# Patient Record
Sex: Female | Born: 1970 | Race: Black or African American | Hispanic: No | Marital: Married | Smoking: Never smoker
Health system: Southern US, Community
[De-identification: ages and names within clinical notes are randomized; demographics above are authoritative.]

## PROBLEM LIST (undated history)

## (undated) ENCOUNTER — Inpatient Hospital Stay (HOSPITAL_COMMUNITY): Payer: Self-pay

## (undated) DIAGNOSIS — I340 Nonrheumatic mitral (valve) insufficiency: Secondary | ICD-10-CM

## (undated) DIAGNOSIS — E559 Vitamin D deficiency, unspecified: Secondary | ICD-10-CM

## (undated) DIAGNOSIS — D649 Anemia, unspecified: Secondary | ICD-10-CM

---

## 2008-08-22 DIAGNOSIS — I340 Nonrheumatic mitral (valve) insufficiency: Secondary | ICD-10-CM

## 2008-08-22 HISTORY — DX: Nonrheumatic mitral (valve) insufficiency: I34.0

## 2014-08-22 DIAGNOSIS — E559 Vitamin D deficiency, unspecified: Secondary | ICD-10-CM

## 2014-08-22 HISTORY — PX: DILATION AND CURETTAGE OF UTERUS: SHX78

## 2014-08-22 HISTORY — DX: Vitamin D deficiency, unspecified: E55.9

## 2017-07-03 ENCOUNTER — Inpatient Hospital Stay (HOSPITAL_COMMUNITY): Payer: Medicaid Other

## 2017-07-03 ENCOUNTER — Inpatient Hospital Stay (HOSPITAL_COMMUNITY)
Admission: AD | Admit: 2017-07-03 | Discharge: 2017-07-03 | Disposition: A | Discharge status: Home / Self Care | Payer: Medicaid Other | Source: Ambulatory Visit | Attending: Family Medicine | Admitting: Family Medicine

## 2017-07-03 ENCOUNTER — Encounter (HOSPITAL_COMMUNITY): Payer: Self-pay

## 2017-07-03 DIAGNOSIS — R1030 Lower abdominal pain, unspecified: Secondary | ICD-10-CM | POA: Insufficient documentation

## 2017-07-03 DIAGNOSIS — O30041 Twin pregnancy, dichorionic/diamniotic, first trimester: Secondary | ICD-10-CM | POA: Insufficient documentation

## 2017-07-03 DIAGNOSIS — O26891 Other specified pregnancy related conditions, first trimester: Secondary | ICD-10-CM

## 2017-07-03 DIAGNOSIS — Z3A08 8 weeks gestation of pregnancy: Secondary | ICD-10-CM

## 2017-07-03 DIAGNOSIS — R109 Unspecified abdominal pain: Secondary | ICD-10-CM

## 2017-07-03 DIAGNOSIS — O26899 Other specified pregnancy related conditions, unspecified trimester: Secondary | ICD-10-CM

## 2017-07-03 HISTORY — DX: Vitamin D deficiency, unspecified: E55.9

## 2017-07-03 HISTORY — DX: Nonrheumatic mitral (valve) insufficiency: I34.0

## 2017-07-03 HISTORY — DX: Anemia, unspecified: D64.9

## 2017-07-03 LAB — CBC
HEMATOCRIT: 33.4 % — AB (ref 36.0–46.0)
Hemoglobin: 10.5 g/dL — ABNORMAL LOW (ref 12.0–15.0)
MCH: 22.6 pg — ABNORMAL LOW (ref 26.0–34.0)
MCHC: 31.4 g/dL (ref 30.0–36.0)
MCV: 71.8 fL — ABNORMAL LOW (ref 78.0–100.0)
PLATELETS: 413 10*3/uL — AB (ref 150–400)
RBC: 4.65 MIL/uL (ref 3.87–5.11)
RDW: 16.8 % — AB (ref 11.5–15.5)
WBC: 9 10*3/uL (ref 4.0–10.5)

## 2017-07-03 LAB — ABO/RH: ABO/RH(D): O POS

## 2017-07-03 LAB — URINALYSIS, ROUTINE W REFLEX MICROSCOPIC
BILIRUBIN URINE: NEGATIVE
Glucose, UA: NEGATIVE mg/dL
HGB URINE DIPSTICK: NEGATIVE
Ketones, ur: NEGATIVE mg/dL
LEUKOCYTES UA: NEGATIVE
Nitrite: NEGATIVE
PROTEIN: 30 mg/dL — AB
SPECIFIC GRAVITY, URINE: 1.023 (ref 1.005–1.030)
pH: 5 (ref 5.0–8.0)

## 2017-07-03 LAB — WET PREP, GENITAL
Clue Cells Wet Prep HPF POC: NONE SEEN
Sperm: NONE SEEN
Trich, Wet Prep: NONE SEEN
YEAST WET PREP: NONE SEEN

## 2017-07-03 LAB — POCT PREGNANCY, URINE: PREG TEST UR: POSITIVE — AB

## 2017-07-03 LAB — HCG, QUANTITATIVE, PREGNANCY: HCG, BETA CHAIN, QUANT, S: 57281 m[IU]/mL — AB (ref <5)

## 2017-07-03 NOTE — MAU Note (Addendum)
Headache Right side Rating pain 8/10 Constant pain  +lower abdominal pain Bilateral; more right than left Rating pain 9/10 Constant pain; cramping in nature  Headache and abdominal pain since friday  +HPT Reports irregular periods; last period in September   Denies vaginal discharge or pain.  +nausea/vomiting

## 2017-07-03 NOTE — Discharge Instructions (Signed)
Abdominal Pain During Pregnancy Abdominal pain is common in pregnancy. Most of the time, it does not cause harm. There are many causes of abdominal pain. Some causes are more serious than others and sometimes the cause is not known. Abdominal pain can be a sign that something is very wrong with the pregnancy or the pain may have nothing to do with the pregnancy. Always tell your health care provider if you have any abdominal pain. Follow these instructions at home:  Do not have sex or put anything in your vagina until your symptoms go away completely.  Watch your abdominal pain for any changes.  Get plenty of rest until your pain improves.  Drink enough fluid to keep your urine clear or pale yellow.  Take over-the-counter or prescription medicines only as told by your health care provider.  Keep all follow-up visits as told by your health care provider. This is important. Contact a health care provider if:  You have a fever.  Your pain gets worse or you have cramping.  Your pain continues after resting. Get help right away if:  You are bleeding, leaking fluid, or passing tissue from the vagina.  You have vomiting or diarrhea that does not go away.  You have painful or bloody urination.  You notice a decrease in your baby's movements.  You feel very weak or faint.  You have shortness of breath.  You develop a severe headache with abdominal pain.  You have abnormal vaginal discharge with abdominal pain. This information is not intended to replace advice given to you by your health care provider. Make sure you discuss any questions you have with your health care provider. Document Released: 08/08/2005 Document Revised: 05/19/2016 Document Reviewed: 03/07/2013 Elsevier Interactive Patient Education  2018 [place] of Pregnancy The first trimester of pregnancy is from week 1 until the end of week 13 (months 1 through 3). A week after a sperm fertilizes an  egg, the egg will implant on the wall of the uterus. This embryo will begin to develop into a baby. Genes from you and your partner will form the baby. The female genes will determine whether the baby will be a boy or a girl. At 6-8 weeks, the eyes and face will be formed, and the heartbeat can be seen on ultrasound. At the end of 12 weeks, all the baby's organs will be formed. Now that you are pregnant, you will want to do everything you can to have a healthy baby. Two of the most important things are to get good prenatal care and to follow your health care provider's instructions. Prenatal care is all the medical care you receive before the baby's birth. This care will help prevent, find, and treat any problems during the pregnancy and childbirth. Body changes during your first trimester Your body goes through many changes during pregnancy. The changes vary from woman to woman.  You may gain or lose a couple of pounds at first.  You may feel sick to your stomach (nauseous) and you may throw up (vomit). If the vomiting is uncontrollable, call your health care provider.  You may tire easily.  You may develop headaches that can be relieved by medicines. All medicines should be approved by your health care provider.  You may urinate more often. Painful urination may mean you have a bladder infection.  You may develop heartburn as a result of your pregnancy.  You may develop constipation because certain hormones are causing the muscles  that push stool through your intestines to slow down.  You may develop hemorrhoids or swollen veins (varicose veins).  Your breasts may begin to grow larger and become tender. Your nipples may stick out more, and the tissue that surrounds them (areola) may become darker.  Your gums may bleed and may be sensitive to brushing and flossing.  Dark spots or blotches (chloasma, mask of pregnancy) may develop on your face. This will likely fade after the baby is  born.  Your menstrual periods will stop.  You may have a loss of appetite.  You may develop cravings for certain kinds of food.  You may have changes in your emotions from day to day, such as being excited to be pregnant or being concerned that something may go wrong with the pregnancy and baby.  You may have more vivid and strange dreams.  You may have changes in your hair. These can include thickening of your hair, rapid growth, and changes in texture. Some women also have hair loss during or after pregnancy, or hair that feels dry or thin. Your hair will most likely return to normal after your baby is born.  What to expect at prenatal visits During a routine prenatal visit:  You will be weighed to make sure you and the baby are growing normally.  Your blood pressure will be taken.  Your abdomen will be measured to track your baby's growth.  The fetal heartbeat will be listened to between weeks 10 and 14 of your pregnancy.  Test results from any previous visits will be discussed.  Your health care provider may ask you:  How you are feeling.  If you are feeling the baby move.  If you have had any abnormal symptoms, such as leaking fluid, bleeding, severe headaches, or abdominal cramping.  If you are using any tobacco products, including cigarettes, chewing tobacco, and electronic cigarettes.  If you have any questions.  Other tests that may be performed during your first trimester include:  Blood tests to find your blood type and to check for the presence of any previous infections. The tests will also be used to check for low iron levels (anemia) and protein on red blood cells (Rh antibodies). Depending on your risk factors, or if you previously had diabetes during pregnancy, you may have tests to check for high blood sugar that affects pregnant women (gestational diabetes).  Urine tests to check for infections, diabetes, or protein in the urine.  An ultrasound to  confirm the proper growth and development of the baby.  Fetal screens for spinal cord problems (spina bifida) and Down syndrome.  HIV (human immunodeficiency virus) testing. Routine prenatal testing includes screening for HIV, unless you choose not to have this test.  You may need other tests to make sure you and the baby are doing well.  Follow these instructions at home: Medicines  Follow your health care provider's instructions regarding medicine use. Specific medicines may be either safe or unsafe to take during pregnancy.  Take a prenatal vitamin that contains at least 600 micrograms (mcg) of folic acid.  If you develop constipation, try taking a stool softener if your health care provider approves. Eating and drinking  Eat a balanced diet that includes fresh fruits and vegetables, whole grains, good sources of protein such as meat, eggs, or tofu, and low-fat dairy. Your health care provider will help you determine the amount of weight gain that is right for you.  Avoid raw meat and uncooked cheese.  These carry germs that can cause birth defects in the baby.  Eating four or five small meals rather than three large meals a day may help relieve nausea and vomiting. If you start to feel nauseous, eating a few soda crackers can be helpful. Drinking liquids between meals, instead of during meals, also seems to help ease nausea and vomiting.  Limit foods that are high in fat and processed sugars, such as fried and sweet foods.  To prevent constipation: ? Eat foods that are high in fiber, such as fresh fruits and vegetables, whole grains, and beans. ? Drink enough fluid to keep your urine clear or pale yellow. Activity  Exercise only as directed by your health care provider. Most women can continue their usual exercise routine during pregnancy. Try to exercise for 30 minutes at least 5 days a week. Exercising will help you: ? Control your weight. ? Stay in shape. ? Be prepared for  labor and delivery.  Experiencing pain or cramping in the lower abdomen or lower back is a good sign that you should stop exercising. Check with your health care provider before continuing with normal exercises.  Try to avoid standing for long periods of time. Move your legs often if you must stand in one place for a long time.  Avoid heavy lifting.  Wear low-heeled shoes and practice good posture.  You may continue to have sex unless your health care provider tells you not to. Relieving pain and discomfort  Wear a good support bra to relieve breast tenderness.  Take warm sitz baths to soothe any pain or discomfort caused by hemorrhoids. Use hemorrhoid cream if your health care provider approves.  Rest with your legs elevated if you have leg cramps or low back pain.  If you develop varicose veins in your legs, wear support hose. Elevate your feet for 15 minutes, 3-4 times a day. Limit salt in your diet. Prenatal care  Schedule your prenatal visits by the twelfth week of pregnancy. They are usually scheduled monthly at first, then more often in the last 2 months before delivery.  Write down your questions. Take them to your prenatal visits.  Keep all your prenatal visits as told by your health care provider. This is important. Safety  Wear your seat belt at all times when driving.  Make a list of emergency phone numbers, including numbers for family, friends, the hospital, and police and fire departments. General instructions  Ask your health care provider for a referral to a local prenatal education class. Begin classes no later than the beginning of month 6 of your pregnancy.  Ask for help if you have counseling or nutritional needs during pregnancy. Your health care provider can offer advice or refer you to specialists for help with various needs.  Do not use hot tubs, steam rooms, or saunas.  Do not douche or use tampons or scented sanitary pads.  Do not cross your legs  for long periods of time.  Avoid cat litter boxes and soil used by cats. These carry germs that can cause birth defects in the baby and possibly loss of the fetus by miscarriage or stillbirth.  Avoid all smoking, herbs, alcohol, and medicines not prescribed by your health care provider. Chemicals in these products affect the formation and growth of the baby.  Do not use any products that contain nicotine or tobacco, such as cigarettes and e-cigarettes. If you need help quitting, ask your health care provider. You may receive counseling support and  other resources to help you quit.  Schedule a dentist appointment. At home, brush your teeth with a soft toothbrush and be gentle when you floss. Contact a health care provider if:  You have dizziness.  You have mild pelvic cramps, pelvic pressure, or nagging pain in the abdominal area.  You have persistent nausea, vomiting, or diarrhea.  You have a bad smelling vaginal discharge.  You have pain when you urinate.  You notice increased swelling in your face, hands, legs, or ankles.  You are exposed to fifth disease or chickenpox.  You are exposed to [place] measles (rubella) and have never had it. Get help right away if:  You have a fever.  You are leaking fluid from your vagina.  You have spotting or bleeding from your vagina.  You have severe abdominal cramping or pain.  You have rapid weight gain or loss.  You vomit blood or material that looks like coffee grounds.  You develop a severe headache.  You have shortness of breath.  You have any kind of trauma, such as from a fall or a car accident. Summary  The first trimester of pregnancy is from week 1 until the end of week 13 (months 1 through 3).  Your body goes through many changes during pregnancy. The changes vary from woman to woman.  You will have routine prenatal visits. During those visits, your health care provider will examine you, discuss any test results you  may have, and talk with you about how you are feeling. This information is not intended to replace advice given to you by your health care provider. Make sure you discuss any questions you have with your health care provider. Document Released: 08/02/2001 Document Revised: 07/20/2016 Document Reviewed: 07/20/2016 Elsevier Interactive Patient Education  2017 [place] Medications in Pregnancy   Acne: Benzoyl Peroxide Salicylic Acid  Backache/Headache: Tylenol: 2 regular strength every 4 hours OR              2 Extra strength every 6 hours  Colds/Coughs/Allergies: Benadryl (alcohol free) 25 mg every 6 hours as needed Breath right strips Claritin Cepacol throat lozenges Chloraseptic throat spray Cold-Eeze- up to three times per day Cough drops, alcohol free Flonase (by prescription only) Guaifenesin Mucinex Robitussin DM (plain only, alcohol free) Saline nasal spray/drops Sudafed (pseudoephedrine) & Actifed ** use only after [redacted] weeks gestation and if you do not have high blood pressure Tylenol Vicks Vaporub Zinc lozenges Zyrtec   Constipation: Colace Ducolax suppositories Fleet enema Glycerin suppositories Metamucil Milk of magnesia Miralax Senokot Smooth move tea  Diarrhea: Kaopectate Imodium A-D  *NO pepto Bismol  Hemorrhoids: Anusol Anusol HC Preparation H Tucks  Indigestion: Tums Maalox Mylanta Zantac  Pepcid  Insomnia: Benadryl (alcohol free) 25mg  every 6 hours as needed Tylenol PM Unisom, no Gelcaps  Leg Cramps: Tums MagGel  Nausea/Vomiting:  Bonine Dramamine Emetrol Ginger extract Sea bands Meclizine  Nausea medication to take during pregnancy:  Unisom (doxylamine succinate 25 mg tablets) Take one tablet daily at bedtime. If symptoms are not adequately controlled, the dose can be increased to a maximum recommended dose of two tablets daily (1/2 tablet in the morning, 1/2 tablet mid-afternoon and one at bedtime). Vitamin B6  100mg  tablets. Take one tablet twice a day (up to 200 mg per day).  Skin Rashes: Aveeno products Benadryl cream or 25mg  every 6 hours as needed Calamine Lotion 1% cortisone cream  Yeast infection: Gyne-lotrimin 7 Monistat 7   **If taking multiple medications, please check  labels to avoid duplicating the same active ingredients °**take medication as directed on the label °** Do not exceed 4000 mg of tylenol in 24 hours °**Do not take medications that contain aspirin or ibuprofen ° ° ° ° °

## 2017-07-03 NOTE — MAU Provider Note (Signed)
History     CSN: [id number]  Arrival date and time: 07/03/17 1016   First Provider Initiated Contact with Patient 07/03/17 1101      Chief Complaint  Patient presents with  . Possible Pregnancy  . Abdominal Pain  . Headache  . Emesis   HPI Holly Frye is a 46 y.o. [id number] at [redacted]w[redacted]d who presents with right sided lower abdominal pain. She states the pain started on Friday and is constant. She describes the pain as cramping and rates it a 9/10. She tried tylenol with no relief. She reports a positive HPT this weekend. She also reports nausea and vomiting over the weekend but none today. She reports a clear discharge with a foul odor but no bleeding. LMP mid September, hx of irregular periods.   OB History    Gravida Para Term Preterm AB Living   17 10 9 1 6 10    SAB TAB Ectopic Multiple Live Births   6       10      Past Medical History:  Diagnosis Date  . Anemia   . Mitral valve regurgitation 08/22/2008  . Vitamin D deficiency 08/22/2014    Past Surgical History:  Procedure Laterality Date  . DILATION AND CURETTAGE OF UTERUS  2016    Family History  Problem Relation Age of Onset  . Cancer Mother   . Cancer Maternal Aunt   . Cancer Maternal Uncle   . Cancer Paternal Aunt   . Cancer Paternal Uncle   . Cancer Maternal Grandmother   . Heart disease Maternal Grandfather     Social History   Tobacco Use  . Smoking status: Never Smoker  . Smokeless tobacco: Never Used  Substance Use Topics  . Alcohol use: No    Frequency: Never  . Drug use: No    Allergies: Not on File  No medications prior to admission.    Review of Systems  Constitutional: Negative.  Negative for fatigue and fever.  HENT: Negative.   Respiratory: Negative.  Negative for shortness of breath.   Cardiovascular: Negative.  Negative for chest pain.  Gastrointestinal: Positive for abdominal pain, nausea and vomiting. Negative for constipation and diarrhea.  Genitourinary: Positive for  vaginal discharge. Negative for dysuria and vaginal bleeding.  Neurological: Negative.  Negative for dizziness and headaches.   Physical Exam   Blood pressure (!) 143/72, pulse 90, temperature 98.3 F (36.8 C), temperature source Oral, resp. rate 18, weight 286 lb 0.6 oz (129.7 kg), last menstrual period 05/03/2017, SpO2 98 %.  Physical Exam  Nursing note and vitals reviewed. Constitutional: She is oriented to person, place, and time. She appears well-developed and well-nourished. No distress.  HENT:  Head: Normocephalic.  Eyes: Pupils are equal, round, and reactive to light.  Cardiovascular: Normal rate, regular rhythm and normal heart sounds.  Respiratory: Effort normal and breath sounds normal. No respiratory distress.  GI: Soft. Bowel sounds are normal. She exhibits no distension. There is tenderness in the right lower quadrant and suprapubic area. There is rebound and guarding.  Neurological: She is alert and oriented to person, place, and time.  Skin: Skin is warm and dry.  Psychiatric: She has a normal mood and affect. Her behavior is normal. Judgment and thought content normal.   Pelvic exam: Cervix pink, visually closed, without lesion, scant white creamy discharge, vaginal walls and external genitalia normal Bimanual exam: Cervix 0/long/high, firm, anterior, neg CMT, uterus nontender, adnexa without tenderness, enlargement, or mass   MAU Course  Procedures Results for orders placed or performed during the hospital encounter of 07/03/17 (from the past 24 hour(s))  Urinalysis, Routine w reflex microscopic     Status: Abnormal   Collection Time: 07/03/17 10:32 AM  Result Value Ref Range   Color, Urine YELLOW YELLOW   APPearance HAZY (A) CLEAR   Specific Gravity, Urine 1.023 1.005 - 1.030   pH 5.0 5.0 - 8.0   Glucose, UA NEGATIVE NEGATIVE mg/dL   Hgb urine dipstick NEGATIVE NEGATIVE   Bilirubin Urine NEGATIVE NEGATIVE   Ketones, ur NEGATIVE NEGATIVE mg/dL   Protein, ur 30  (A) NEGATIVE mg/dL   Nitrite NEGATIVE NEGATIVE   Leukocytes, UA NEGATIVE NEGATIVE   RBC / HPF 0-5 0 - 5 RBC/hpf   WBC, UA 0-5 0 - 5 WBC/hpf   Bacteria, UA RARE (A) NONE SEEN   Squamous Epithelial / LPF 0-5 (A) NONE SEEN   Mucus PRESENT   Pregnancy, urine POC     Status: Abnormal   Collection Time: 07/03/17 10:57 AM  Result Value Ref Range   Preg Test, Ur POSITIVE (A) NEGATIVE   US Ob Comp Less 14 Wks  Result Date: 07/03/2017 CLINICAL DATA:  Abdominal pain, pregnancy EXAM: TWIN OBSTETRIC <14WK [place] AND TRANSVAGINAL OB US COMPARISON:  None. FINDINGS: Number of IUPs:  2 Chorionicity/Amnionicity:  Dichorionic-diamniotic (thick membrane) TWIN 1 Yolk sac:  Visualized Embryo:  Not visualized Cardiac Activity: Not visualized Heart Rate:  bpm MSD: 26.1  mm   7 w   4  d CRL:    mm    w  d                  [place] EDC: TWIN 2 Yolk sac:  Not visualized Embryo:  Not visualized Cardiac Activity: Not visualized Heart Rate:  bpm MSD: 7.4  mm   5 w   3  d CRL:    mm    w  d                  [place] EDC: Subchorionic hemorrhage:  Small subchorionic hemorrhage Maternal uterus/adnexae: Posterior mid uterus fibroid measures up to 3.5 cm. Trace free fluid in the pelvis. IMPRESSION: Dichorionic diamniotic twin intrauterine pregnancy with discordant size is of the gestational sacs, the largest with SI estimated gestational age of [redacted] weeks 4 days and the smaller with an estimated gestational age of [redacted] weeks 3 days. No fetal pole in either gestational sac currently. Recommend follow-up ultrasound in 14 days to assess progression. Small subchorionic hemorrhage. Posterior midbody 3.5 cm uterine fibroid. Electronically Signed   By: Rolm Baptise M.D.   On: 07/03/2017 12:36   US Ob Comp Addl Gest Less 14 Wks  Result Date: 07/03/2017 CLINICAL DATA:  Abdominal pain, pregnancy EXAM: TWIN OBSTETRIC <14WK [place] AND TRANSVAGINAL OB US COMPARISON:  None. FINDINGS: Number of IUPs:  2 Chorionicity/Amnionicity:  Dichorionic-diamniotic (thick membrane)  TWIN 1 Yolk sac:  Visualized Embryo:  Not visualized Cardiac Activity: Not visualized Heart Rate:  bpm MSD: 26.1  mm   7 w   4  d CRL:    mm    w  d                  [place] EDC: TWIN 2 Yolk sac:  Not visualized Embryo:  Not visualized Cardiac Activity: Not visualized Heart Rate:  bpm MSD: 7.4  mm   5 w   3  d CRL:    mm  w  d                  [place] EDC: Subchorionic hemorrhage:  Small subchorionic hemorrhage Maternal uterus/adnexae: Posterior mid uterus fibroid measures up to 3.5 cm. Trace free fluid in the pelvis. IMPRESSION: Dichorionic diamniotic twin intrauterine pregnancy with discordant size is of the gestational sacs, the largest with SI estimated gestational age of [redacted] weeks 4 days and the smaller with an estimated gestational age of [redacted] weeks 3 days. No fetal pole in either gestational sac currently. Recommend follow-up ultrasound in 14 days to assess progression. Small subchorionic hemorrhage. Posterior midbody 3.5 cm uterine fibroid. Electronically Signed   By: Rolm Baptise M.D.   On: 07/03/2017 12:36   US Ob Transvaginal  Result Date: 07/03/2017 CLINICAL DATA:  Abdominal pain, pregnancy EXAM: TWIN OBSTETRIC <14WK [place] AND TRANSVAGINAL OB US COMPARISON:  None. FINDINGS: Number of IUPs:  2 Chorionicity/Amnionicity:  Dichorionic-diamniotic (thick membrane) TWIN 1 Yolk sac:  Visualized Embryo:  Not visualized Cardiac Activity: Not visualized Heart Rate:  bpm MSD: 26.1  mm   7 w   4  d CRL:    mm    w  d                  [place] EDC: TWIN 2 Yolk sac:  Not visualized Embryo:  Not visualized Cardiac Activity: Not visualized Heart Rate:  bpm MSD: 7.4  mm   5 w   3  d CRL:    mm    w  d                  [place] EDC: Subchorionic hemorrhage:  Small subchorionic hemorrhage Maternal uterus/adnexae: Posterior mid uterus fibroid measures up to 3.5 cm. Trace free fluid in the pelvis. IMPRESSION: Dichorionic diamniotic twin intrauterine pregnancy with discordant size is of the gestational sacs, the largest with SI estimated gestational  age of [redacted] weeks 4 days and the smaller with an estimated gestational age of [redacted] weeks 3 days. No fetal pole in either gestational sac currently. Recommend follow-up ultrasound in 14 days to assess progression. Small subchorionic hemorrhage. Posterior midbody 3.5 cm uterine fibroid. Electronically Signed   By: Rolm Baptise M.D.   On: 07/03/2017 12:36   MDM UA, UPT CBC, HCG, ABO/Rh Wet prep and gc/chlamydia US OB Transvaginal  US OB Comp less 14 weeks- twin intrauterine pregnancy, no fetal pole in either sac yet, small subchorionic hemorrhage, 3.5cm uterine fibroid Low suspicion for appendicitis due to location of pain and absence of fever, leukocytosis and GI complaints.  Assessment and Plan   1. Dichorionic diamniotic twin pregnancy in first trimester   2. Abdominal pain affecting pregnancy   3. [redacted] weeks gestation of pregnancy    -Discharge home in stable condition -Strict appendicitis precautions discussed -Patient advised to follow-[facility] with [facility] ultrasound for follow up ultrasound, will call with appointment Patient may return to MAU as needed or if her condition were to change or worsen   Wende Mott CNM 07/03/2017, 11:28 AM

## 2017-07-04 LAB — GC/CHLAMYDIA PROBE AMP (~~LOC~~) NOT AT ARMC
Chlamydia: NEGATIVE
Neisseria Gonorrhea: NEGATIVE

## 2017-07-17 ENCOUNTER — Ambulatory Visit (HOSPITAL_COMMUNITY)
Admission: RE | Admit: 2017-07-17 | Discharge: 2017-07-17 | Disposition: A | Discharge status: Home / Self Care | Payer: Medicaid Other | Source: Ambulatory Visit

## 2017-07-17 ENCOUNTER — Ambulatory Visit: Payer: Self-pay | Admitting: General Practice

## 2017-07-17 DIAGNOSIS — O30041 Twin pregnancy, dichorionic/diamniotic, first trimester: Secondary | ICD-10-CM | POA: Insufficient documentation

## 2017-07-17 DIAGNOSIS — O3680X1 Pregnancy with inconclusive fetal viability, fetus 1: Secondary | ICD-10-CM

## 2017-07-17 DIAGNOSIS — O3411 Maternal care for benign tumor of corpus uteri, first trimester: Secondary | ICD-10-CM | POA: Insufficient documentation

## 2017-07-17 DIAGNOSIS — Z3A01 Less than 8 weeks gestation of pregnancy: Secondary | ICD-10-CM | POA: Diagnosis not present

## 2017-07-17 DIAGNOSIS — D259 Leiomyoma of uterus, unspecified: Secondary | ICD-10-CM | POA: Diagnosis not present

## 2017-07-17 NOTE — Progress Notes (Signed)
I have reviewed the nurse's note and plan of care. I agree.  Marcille Buffy  5:00 PM 07/17/17

## 2017-07-17 NOTE — Progress Notes (Signed)
Patient here for viability ultrasound results today. Reviewed with Marcille Buffy who states ultrasound shows no progression of baby b and baby a has had some growth compared to last ultrasound but not confirmatory for viability. Patient should have repeat ultrasound in 2 weeks. Informed patient of results & recommendations. Patient had lots of questions about what was seen today compared to last time and what they are waiting to see. Answered all of patient's questions. Patient was upset about loss of baby b. Scheduled follow up ultrasound for 12/10 @ 1045 and informed patient. Recommended she go to MAU for heavy bleeding like a period or severe abdominal pain. Patient verbalized understanding and had no questions

## 2017-07-23 ENCOUNTER — Inpatient Hospital Stay (HOSPITAL_COMMUNITY)
Admission: AD | Admit: 2017-07-23 | Discharge: 2017-07-23 | Disposition: A | Discharge status: Home / Self Care | Payer: Medicaid Other | Source: Ambulatory Visit | Attending: Obstetrics & Gynecology | Admitting: Obstetrics & Gynecology

## 2017-07-23 ENCOUNTER — Inpatient Hospital Stay (HOSPITAL_COMMUNITY): Payer: Medicaid Other

## 2017-07-23 ENCOUNTER — Encounter (HOSPITAL_COMMUNITY): Payer: Self-pay

## 2017-07-23 ENCOUNTER — Other Ambulatory Visit: Payer: Self-pay

## 2017-07-23 DIAGNOSIS — N939 Abnormal uterine and vaginal bleeding, unspecified: Secondary | ICD-10-CM | POA: Diagnosis present

## 2017-07-23 DIAGNOSIS — Z3A00 Weeks of gestation of pregnancy not specified: Secondary | ICD-10-CM | POA: Insufficient documentation

## 2017-07-23 DIAGNOSIS — R1030 Lower abdominal pain, unspecified: Secondary | ICD-10-CM | POA: Insufficient documentation

## 2017-07-23 DIAGNOSIS — R9389 Abnormal findings on diagnostic imaging of other specified body structures: Secondary | ICD-10-CM | POA: Insufficient documentation

## 2017-07-23 DIAGNOSIS — O3110X0 Continuing pregnancy after spontaneous abortion of one fetus or more, unspecified trimester, not applicable or unspecified: Secondary | ICD-10-CM

## 2017-07-23 DIAGNOSIS — O200 Threatened abortion: Secondary | ICD-10-CM | POA: Insufficient documentation

## 2017-07-23 DIAGNOSIS — M545 Low back pain: Secondary | ICD-10-CM | POA: Diagnosis present

## 2017-07-23 DIAGNOSIS — O209 Hemorrhage in early pregnancy, unspecified: Secondary | ICD-10-CM

## 2017-07-23 DIAGNOSIS — O09521 Supervision of elderly multigravida, first trimester: Secondary | ICD-10-CM | POA: Insufficient documentation

## 2017-07-23 DIAGNOSIS — O26891 Other specified pregnancy related conditions, first trimester: Secondary | ICD-10-CM | POA: Insufficient documentation

## 2017-07-23 DIAGNOSIS — O3121X0 Continuing pregnancy after intrauterine death of one fetus or more, first trimester, not applicable or unspecified: Secondary | ICD-10-CM

## 2017-07-23 LAB — CBC
HCT: 35.4 % — ABNORMAL LOW (ref 36.0–46.0)
Hemoglobin: 10.6 g/dL — ABNORMAL LOW (ref 12.0–15.0)
MCH: 22.5 pg — AB (ref 26.0–34.0)
MCHC: 29.9 g/dL — ABNORMAL LOW (ref 30.0–36.0)
MCV: 75.2 fL — AB (ref 78.0–100.0)
PLATELETS: 376 10*3/uL (ref 150–400)
RBC: 4.71 MIL/uL (ref 3.87–5.11)
RDW: 18.3 % — ABNORMAL HIGH (ref 11.5–15.5)
WBC: 8.6 10*3/uL (ref 4.0–10.5)

## 2017-07-23 NOTE — Discharge Instructions (Signed)
Pelvic Rest Pelvic rest may be recommended if:  Your placenta is partially or completely covering the opening of your cervix (placenta previa).  There is bleeding between the wall of the uterus and the amniotic sac in the first trimester of pregnancy (subchorionic hemorrhage).  You went into labor too early (preterm labor).  Based on your overall health and the health of your baby, your health care provider will decide if pelvic rest is right for you. How do I rest my pelvis? For as long as told by your health care provider:  Do not have sex, sexual stimulation, or an orgasm.  Do not use tampons. Do not douche. Do not put anything in your vagina.  Do not lift anything that is heavier than 10 lb (4.5 kg).  Avoid activities that take a lot of effort (are strenuous).  Avoid any activity in which your pelvic muscles could become strained.  When should I seek medical care? Seek medical care if you have:  Cramping pain in your lower abdomen.  Vaginal discharge.  A low, dull backache.  Regular contractions.  Uterine tightening.  When should I seek immediate medical care? Seek immediate medical care if:  You have vaginal bleeding and you are pregnant.  This information is not intended to replace advice given to you by your health care provider. Make sure you discuss any questions you have with your health care provider. Document Released: 12/03/2010 Document Revised: 01/14/2016 Document Reviewed: 02/09/2015 Elsevier Interactive Patient Education  2018 [employer].  Threatened Miscarriage A threatened miscarriage is when you have vaginal bleeding during your first 20 weeks of pregnancy but the pregnancy has not ended. Your doctor will do tests to make sure you are still pregnant. The cause of the bleeding may not be known. This condition does not mean your pregnancy will end. It does increase the risk of it ending (complete miscarriage). Follow these instructions at  home:  Make sure you keep all your doctor visits for prenatal care.  Get plenty of rest.  Do not have sex or use tampons if you have vaginal bleeding.  Do not douche.  Do not smoke or use drugs.  Do not drink alcohol.  Avoid caffeine. Contact a doctor if:  You have light bleeding from your vagina.  You have belly pain or cramping.  You have a fever. Get help right away if:  You have heavy bleeding from your vagina.  You have clots of blood coming from your vagina.  You have bad pain or cramps in your low back or belly.  You have fever, chills, and bad belly pain. This information is not intended to replace advice given to you by your health care provider. Make sure you discuss any questions you have with your health care provider. Document Released: 07/21/2008 Document Revised: 01/14/2016 Document Reviewed: 06/04/2013 Elsevier Interactive Patient Education  [employer].

## 2017-07-23 NOTE — MAU Note (Signed)
Abdominal pain with increased bleeding with blood clots now.

## 2017-07-23 NOTE — MAU Provider Note (Signed)
History     CSN: [id number]  Arrival date and time: 07/23/17 1408   First Provider Initiated Contact with Patient 07/23/17 1526      Chief Complaint  Patient presents with  . Abdominal Cramping  . Vaginal Bleeding   HPI   Ms.Holly Frye is a 46 y.o. female [id number] unknown gestation here in MAU with vaginal bleeding and abdominal pain. The bleeding started early this morning and has progressed throughout the day to menstrual like bleeding.  The pain is in her lower back and lower abdomen.  [place] on 11/26 show vanishing twin.  Patient rates her pain 5/10; she declined pain medication.   OB History    Gravida Para Term Preterm AB Living   17 10 9 1 6 10    SAB TAB Ectopic Multiple Live Births   6       10      Past Medical History:  Diagnosis Date  . Anemia   . Mitral valve regurgitation 08/22/2008  . Vitamin D deficiency 08/22/2014    Past Surgical History:  Procedure Laterality Date  . DILATION AND CURETTAGE OF UTERUS  2016    Family History  Problem Relation Age of Onset  . Cancer Mother   . Cancer Maternal Aunt   . Cancer Maternal Uncle   . Cancer Paternal Aunt   . Cancer Paternal Uncle   . Cancer Maternal Grandmother   . Heart disease Maternal Grandfather     Social History   Tobacco Use  . Smoking status: Never Smoker  . Smokeless tobacco: Never Used  Substance Use Topics  . Alcohol use: No    Frequency: Never  . Drug use: No    Allergies:  Allergies  Allergen Reactions  . Lactose Intolerance (Gi) Diarrhea    Medications Prior to Admission  Medication Sig Dispense Refill Last Dose  . acetaminophen (TYLENOL) 500 MG tablet Take 1,000 mg by mouth every 6 (six) hours as needed for moderate pain or headache.   Past Week at Unknown time  . Prenatal Vit-Fe Fumarate-FA (PRENATAL MULTIVITAMIN) TABS tablet Take 1 tablet by mouth daily at 12 noon.   07/23/2017 at Unknown time   Results for orders placed or performed during the hospital encounter of  07/23/17 (from the past 48 hour(s))  CBC     Status: Abnormal   Collection Time: 07/23/17  3:32 PM  Result Value Ref Range   WBC 8.6 4.0 - 10.5 K/uL   RBC 4.71 3.87 - 5.11 MIL/uL   Hemoglobin 10.6 (L) 12.0 - 15.0 g/dL   HCT 35.4 (L) 36.0 - 46.0 %   MCV 75.2 (L) 78.0 - 100.0 fL   MCH 22.5 (L) 26.0 - 34.0 pg   MCHC 29.9 (L) 30.0 - 36.0 g/dL   RDW 18.3 (H) 11.5 - 15.5 %   Platelets 376 150 - 400 K/uL   US Ob Transvaginal  Result Date: 07/23/2017 CLINICAL DATA:  History of twin pregnancy. Increased vaginal bleeding. EXAM: TRANSVAGINAL OB ULTRASOUND TECHNIQUE: Transvaginal ultrasound was performed for complete evaluation of the gestation as well as the maternal uterus, adnexal regions, and pelvic cul-de-sac. COMPARISON:  None. FINDINGS: Intrauterine gestational sac: There is an intrauterine gestational sac identified within mean diameter 2.8 cm. Yolk sac:  Visualized. Embryo:  Probable Cardiac Activity: Not Visualized. MSD: 28  mm   7 w   5  d CRL:   29  mm   5 w 5 d  [place] EDC: March 20, 2018 Subchorionic hemorrhage: The previously identified subchorionic hemorrhage is not seen today. An adjacent cystic structure measures 8 x 8 x 8 mm with no yolk sac or fetal pole. This was previously described as a second gestational sac. An endometrial cyst or synechiae associated with the first gestational sac are also possible. Maternal uterus/adnexae: Normal IMPRESSION: 1. There is at least 1 intrauterine pregnancy. The mean sac diameter is 28 mm today versus 23.1 mm previously. There is a yolk sac. There is a suspected fetal pole. The suspected fetal pole measures 2.9 mm today versus 3.7 mm previously. No cardiac activity is identified. The findings are suspicious for but not diagnostic of non viability. Recommend a follow-up ultrasound to assess viability. 2. The subchorionic hemorrhage seen previously is not visualized today. 3. The adjacent fluid collection could represent a second gestational sac.  The mean diameter is 8 mm today versus 6.9 mm previously. No yolk sac or fetal pole. An endometrial cyst or a synechiae associated with the first gestational sac are possibilities as well. Recommend attention on follow-up. Electronically Signed   By: Dorise Bullion III M.D   On: 07/23/2017 16:37   Review of Systems  Constitutional: Negative for fever.  Gastrointestinal: Positive for abdominal pain.  Genitourinary: Positive for vaginal bleeding. Negative for dysuria.   Physical Exam   Blood pressure 114/67, pulse 90, temperature 98.8 F (37.1 C), temperature source Oral, resp. rate 18, last menstrual period 05/03/2017.  Physical Exam  Constitutional: She is oriented to person, place, and time. She appears well-developed and well-nourished. No distress.  GI: There is generalized tenderness. There is no rigidity and no guarding.  Genitourinary:  Genitourinary Comments: Vagina - Small amount of dark red blood in the vaginal canal, no odor  Cervix - + active bleeding, small amount   Bimanual exam: Cervix closed, anterior  Uterus non tender, enlarged Chaperone present for exam.   Musculoskeletal: Normal range of motion.  Neurological: She is alert and oriented to person, place, and time.  Skin: Skin is warm. She is not diaphoretic.  Psychiatric: Her behavior is normal.   MAU Course  Procedures  None  MDM  O positive blood type Patient appears uncomfortable: pain medication offered and patient declined [place] ordered CBC Discussed [place] in detail with the patient and family. [place] is essentially unchanged from previous US. Unable to say whether this is bleeding from subchorionic hemorrhage that was seen on previous US. [place] is suspicious for non viable pregnancy, however not definitive. Patient is scheduled for an [place] on 12/12 and is encouraged to keep that [place]. This is a unplanned, however desired pregnancy.   Assessment and Plan   A:  1. Threatened miscarriage   2. Vaginal bleeding in  pregnancy, first trimester   3. Vanishing twin syndrome     P:  Discharge home with strict return precautions Miscarriage precautions with bleeding precautions Follow up for [place] as scheduled Ok to use tylenol as needed as directed on the bottle   Noni Saupe I, NP 07/23/2017 5:32 PM

## 2017-07-24 ENCOUNTER — Inpatient Hospital Stay (HOSPITAL_COMMUNITY)
Admission: AD | Admit: 2017-07-24 | Discharge: 2017-07-25 | Disposition: A | Discharge status: Home / Self Care | Payer: Medicaid Other | Source: Ambulatory Visit | Attending: Obstetrics and Gynecology | Admitting: Obstetrics and Gynecology

## 2017-07-24 DIAGNOSIS — O209 Hemorrhage in early pregnancy, unspecified: Secondary | ICD-10-CM

## 2017-07-24 DIAGNOSIS — O039 Complete or unspecified spontaneous abortion without complication: Secondary | ICD-10-CM | POA: Diagnosis not present

## 2017-07-24 DIAGNOSIS — Z79899 Other long term (current) drug therapy: Secondary | ICD-10-CM | POA: Insufficient documentation

## 2017-07-24 DIAGNOSIS — Z9889 Other specified postprocedural states: Secondary | ICD-10-CM | POA: Diagnosis not present

## 2017-07-24 DIAGNOSIS — E559 Vitamin D deficiency, unspecified: Secondary | ICD-10-CM | POA: Insufficient documentation

## 2017-07-24 DIAGNOSIS — Z91011 Allergy to milk products: Secondary | ICD-10-CM | POA: Diagnosis not present

## 2017-07-24 DIAGNOSIS — Z3A01 Less than 8 weeks gestation of pregnancy: Secondary | ICD-10-CM | POA: Insufficient documentation

## 2017-07-24 LAB — CBC
HEMATOCRIT: 34.3 % — AB (ref 36.0–46.0)
HEMOGLOBIN: 10.4 g/dL — AB (ref 12.0–15.0)
MCH: 22.8 pg — ABNORMAL LOW (ref 26.0–34.0)
MCHC: 30.3 g/dL (ref 30.0–36.0)
MCV: 75.1 fL — ABNORMAL LOW (ref 78.0–100.0)
Platelets: 351 10*3/uL (ref 150–400)
RBC: 4.57 MIL/uL (ref 3.87–5.11)
RDW: 18.1 % — ABNORMAL HIGH (ref 11.5–15.5)
WBC: 8.2 10*3/uL (ref 4.0–10.5)

## 2017-07-24 NOTE — MAU Note (Signed)
Pt here with c/o heavy bleeding that started about 2200. Severe cramping as well. Was here yesterday with a small amount of bleeding.

## 2017-07-25 ENCOUNTER — Encounter (HOSPITAL_COMMUNITY): Payer: Self-pay | Admitting: *Deleted

## 2017-07-25 ENCOUNTER — Inpatient Hospital Stay (HOSPITAL_COMMUNITY): Payer: Medicaid Other

## 2017-07-25 DIAGNOSIS — O039 Complete or unspecified spontaneous abortion without complication: Secondary | ICD-10-CM

## 2017-07-25 MED ORDER — HYDROMORPHONE HCL 1 MG/ML IJ SOLN
1.0000 mg | Freq: Once | INTRAMUSCULAR | Status: AC
  Administered 2017-07-25: 1 mg via INTRAMUSCULAR
  Filled 2017-07-25: qty 1

## 2017-07-25 MED ORDER — OXYCODONE-ACETAMINOPHEN 5-325 MG PO TABS: 1.0000 | ORAL_TABLET | Freq: Four times a day (QID) | ORAL | 0 refills | Status: AC | PRN

## 2017-07-25 MED ORDER — OXYCODONE-ACETAMINOPHEN 5-325 MG PO TABS: 2.0000 | ORAL_TABLET | Freq: Once | ORAL | Status: DC

## 2017-07-25 NOTE — MAU Note (Signed)
Pt reports increase in bleeding since 2200. Pt rates pain 10/10. Has soaked through 2 pads in the last hour.

## 2017-07-25 NOTE — MAU Provider Note (Signed)
Chief Complaint: Vaginal Bleeding and Abdominal Pain   None     SUBJECTIVE HPI: Holly Frye is a 46 y.o. [id number] at [redacted]w[redacted]d by LMP who presents to maternity admissions reporting severe lower abdominal cramps and heavy bleeding with blood clots starting tonight. She presented to MAU on 07/23/17 with bleeding and pain and had US showing suspicion for failed pregnancy but no definitive diagnosis.  She reports the pain and bleeding increased tonight and became worse before she came to MAU.  She reports soaking a pad per hour with large clots and tissue at home. Since arrival in MAU, her pain is increasing, as low abdominal intermittent cramping, but her bleeding has reduced.  She has not tried any treatments. There are no other associated symptoms.    HPI  Past Medical History:  Diagnosis Date  . Anemia   . Mitral valve regurgitation 08/22/2008  . Vitamin D deficiency 08/22/2014   Past Surgical History:  Procedure Laterality Date  . DILATION AND CURETTAGE OF UTERUS  2016   Social History   Socioeconomic History  . Marital status: Married    Spouse name: Not on file  . Number of children: Not on file  . Years of education: Not on file  . Highest education level: Not on file  Social Needs  . Financial resource strain: Not on file  . Food insecurity - worry: Not on file  . Food insecurity - inability: Not on file  . Transportation needs - medical: Not on file  . Transportation needs - non-medical: Not on file  Occupational History  . Not on file  Tobacco Use  . Smoking status: Never Smoker  . Smokeless tobacco: Never Used  Substance and Sexual Activity  . Alcohol use: No    Frequency: Never  . Drug use: No  . Sexual activity: Yes    Birth control/protection: None  Other Topics Concern  . Not on file  Social History Narrative  . Not on file   No current facility-administered medications on file prior to encounter.    Current Outpatient Medications on File Prior to  Encounter  Medication Sig Dispense Refill  . acetaminophen (TYLENOL) 500 MG tablet Take 1,000 mg by mouth every 6 (six) hours as needed for moderate pain or headache.    . Prenatal Vit-Fe Fumarate-FA (PRENATAL MULTIVITAMIN) TABS tablet Take 1 tablet by mouth daily at 12 noon.     Allergies  Allergen Reactions  . Lactose Intolerance (Gi) Diarrhea    ROS:  Review of Systems  Constitutional: Negative for chills, fatigue and fever.  Respiratory: Negative for shortness of breath.   Cardiovascular: Negative for chest pain.  Gastrointestinal: Positive for abdominal pain. Negative for nausea and vomiting.  Genitourinary: Positive for pelvic pain and vaginal bleeding. Negative for difficulty urinating, dysuria, flank pain, vaginal discharge and vaginal pain.  Musculoskeletal: Positive for back pain.  Neurological: Negative for dizziness and headaches.  Psychiatric/Behavioral: Negative.      I have reviewed patient's Past Medical Hx, Surgical Hx, Family Hx, Social Hx, medications and allergies.   Physical Exam   Patient Vitals for the past 24 hrs:  BP Temp Temp src Pulse Resp Height Weight  07/25/17 0234 131/72 - - 83 18 - -  07/24/17 2341 130/69 98.4 F (36.9 C) Oral 96 20 5\' 5"  (1.651 m) 286 lb (129.7 kg)   Constitutional: Well-developed, well-nourished female in moderate distress.  Cardiovascular: normal rate Respiratory: normal effort GI: Abd soft, non-tender. Pos BS x 4 MS: Extremities  nontender, no edema, normal ROM Neurologic: Alert and oriented x 4.  GU: Neg CVAT.  PELVIC EXAM: Cervix pink, visually closed, without lesion,moderate amount dark red bleeding with small clots, vaginal walls and external genitalia normal   LAB RESULTS Results for orders placed or performed during the hospital encounter of 07/24/17 (from the past 24 hour(s))  CBC     Status: Abnormal   Collection Time: 07/24/17 11:46 PM  Result Value Ref Range   WBC 8.2 4.0 - 10.5 K/uL   RBC 4.57 3.87 - 5.11  MIL/uL   Hemoglobin 10.4 (L) 12.0 - 15.0 g/dL   HCT 34.3 (L) 36.0 - 46.0 %   MCV 75.1 (L) 78.0 - 100.0 fL   MCH 22.8 (L) 26.0 - 34.0 pg   MCHC 30.3 30.0 - 36.0 g/dL   RDW 18.1 (H) 11.5 - 15.5 %   Platelets 351 150 - 400 K/uL    --/--/O POS (11/12 1106)  IMAGING US Ob Transvaginal  Result Date: 07/25/2017 CLINICAL DATA:  46 year old female with cramping and bleeding. EXAM: TRANSVAGINAL OB ULTRASOUND TECHNIQUE: Transvaginal ultrasound was performed for complete evaluation of the gestation as well as the maternal uterus, adnexal regions, and pelvic cul-de-sac. COMPARISON:  Pelvic ultrasound dated 07/23/2017 FINDINGS: Intrauterine gestational sac: The previously seen gestational sac is not visualized on the current exam. A small cystic structure corresponding to the cystic structure seen adjacent to the gestational sac on the prior ultrasound is seen with a mean sac diameter of 8 mm. No yolk sac or fetal pole identified within this cystic structure. This was previously described as a second gestational sac or an endometrial cyst. This small cystic structure is favored to less likely represent a gestational sac. However, if this is a true gestational sac would correspond to a gestational age of [redacted] weeks, 3 days. The endometrium is thickened and likely contains blood product. No increased vascularity identified to the endometrium. Subchorionic hemorrhage:  None visualized. Maternal uterus/adnexae: There is a 2.5 x 2.0 cm posterior fundal intramural fibroid. The maternal ovaries are not visualized. Small amount of fluid is noted within the pelvis. IMPRESSION: 1. The previously seen gestational sac is not visualized on the today's ultrasound consistent with interval spontaneous abortion. 2. A small cystic structure corresponding to the small cyst seen adjacent to the gestational sac on the prior ultrasound is again seen on today's ultrasound. No fetal pole or yolk sac identified within this cystic structure.  3. Probable small amount of blood clot within the endometrial canal. 4. Small free fluid within the pelvis. 5. Nonvisualization of the ovaries. Electronically Signed   By: Anner Crete M.D.   On: 07/25/2017 02:00   US Ob Transvaginal  Result Date: 07/23/2017 CLINICAL DATA:  History of twin pregnancy. Increased vaginal bleeding. EXAM: TRANSVAGINAL OB ULTRASOUND TECHNIQUE: Transvaginal ultrasound was performed for complete evaluation of the gestation as well as the maternal uterus, adnexal regions, and pelvic cul-de-sac. COMPARISON:  None. FINDINGS: Intrauterine gestational sac: There is an intrauterine gestational sac identified within mean diameter 2.8 cm. Yolk sac:  Visualized. Embryo:  Probable Cardiac Activity: Not Visualized. MSD: 28  mm   7 w   5  d CRL:   29  mm   5 w 5 d                  [place] EDC: March 20, 2018 Subchorionic hemorrhage: The previously identified subchorionic hemorrhage is not seen today. An adjacent cystic structure measures 8 x 8 x 8  mm with no yolk sac or fetal pole. This was previously described as a second gestational sac. An endometrial cyst or synechiae associated with the first gestational sac are also possible. Maternal uterus/adnexae: Normal IMPRESSION: 1. There is at least 1 intrauterine pregnancy. The mean sac diameter is 28 mm today versus 23.1 mm previously. There is a yolk sac. There is a suspected fetal pole. The suspected fetal pole measures 2.9 mm today versus 3.7 mm previously. No cardiac activity is identified. The findings are suspicious for but not diagnostic of non viability. Recommend a follow-up ultrasound to assess viability. 2. The subchorionic hemorrhage seen previously is not visualized today. 3. The adjacent fluid collection could represent a second gestational sac. The mean diameter is 8 mm today versus 6.9 mm previously. No yolk sac or fetal pole. An endometrial cyst or a synechiae associated with the first gestational sac are possibilities as well.  Recommend attention on follow-up. Electronically Signed   By: Dorise Bullion III M.D   On: 07/23/2017 16:37   US Ob Transvaginal  Result Date: 07/17/2017 CLINICAL DATA:  Diamniotic dichorionic twin pregnancy. Assess viability. By first ultrasound patient is 8 weeks 3 days. EXAM: TWIN OBSTETRICAL ULTRASOUND <14 WKS COMPARISON:  07/03/2017 FINDINGS: Number of IUPs:  2 Chorionicity/Amnionicity:  Diamniotic dichorionic TWIN 1 Yolk sac:  Present Embryo:  Small structure likely representing the embryo Cardiac Activity: Equivocal, possible activity identified at real-time evaluation. Heart Rate: Not measurable bpm MSD: 23.1  mm   7 w   2  d CRL:   3.7  mm   6 w 0 d                  [place] EDC: 03/12/2018 TWIN 2 Yolk sac:  None Embryo:  None Cardiac Activity: None Heart Rate: Absent bpm MSD: 6.9  mm   5 w   2  d Subchorionic hemorrhage:  Moderate subchorionic hemorrhage. Maternal uterus/adnexae: Right corpus luteum cyst. The left ovary is not well seen because of intervening bowel loops. Anterior uterine fibroid is 3.5 x 2.4 x 3.2 cm. No free pelvic fluid. IMPRESSION: 1. Interval appearance of probable embryo measuring 3.7 mm and 6 weeks 0 days in gestational sac 1. Possible fetal cardiac activity, only identified at real-time evaluation. 2. Lack of progression of the structures within gestational sac 2. 3. Moderate subchorionic hemorrhage. 4. Uterine fibroid. Electronically Signed   By: Nolon Nations M.D.   On: 07/17/2017 13:59   US Ob Comp Addl Gest Less 14 Wks  Result Date: 07/03/2017 CLINICAL DATA:  Abdominal pain, pregnancy EXAM: TWIN OBSTETRIC <14WK [place] AND TRANSVAGINAL OB US COMPARISON:  None. FINDINGS: Number of IUPs:  2 Chorionicity/Amnionicity:  Dichorionic-diamniotic (thick membrane) TWIN 1 Yolk sac:  Visualized Embryo:  Not visualized Cardiac Activity: Not visualized Heart Rate:  bpm MSD: 26.1  mm   7 w   4  d CRL:    mm    w  d                  [place] EDC: TWIN 2 Yolk sac:  Not visualized Embryo:  Not  visualized Cardiac Activity: Not visualized Heart Rate:  bpm MSD: 7.4  mm   5 w   3  d CRL:    mm    w  d                  [place] EDC: Subchorionic hemorrhage:  Small subchorionic hemorrhage Maternal uterus/adnexae: Posterior mid uterus fibroid  measures up to 3.5 cm. Trace free fluid in the pelvis. IMPRESSION: Dichorionic diamniotic twin intrauterine pregnancy with discordant size is of the gestational sacs, the largest with SI estimated gestational age of [redacted] weeks 4 days and the smaller with an estimated gestational age of [redacted] weeks 3 days. No fetal pole in either gestational sac currently. Recommend follow-up ultrasound in 14 days to assess progression. Small subchorionic hemorrhage. Posterior midbody 3.5 cm uterine fibroid. Electronically Signed   By: Rolm Baptise M.D.   On: 07/03/2017 12:36   MAU Management/MDM: Ordered labs and [place] and reviewed results.  Hgb stable from previous labs.  Bleeding reduced while pt in MAU and pt without symptoms of anemia.  [place] indicates SAB with no GS identified on today's imaging.  Pt with significant pain upon arrival in MAU, Dilaudid 1 mg IM given x 1 dose with complete resolution of pain.  F/U in [facility] office in 1 week for hcg and 2 weeks for visit with provider. Return to MAU as needed for emergencies.   Percocet 5/325, take 1-2 Q 6 hours PRN x 10 tabs.  Pt discharged with strict bleeding precautions.  ASSESSMENT 1. SAB (spontaneous abortion)   2. Vaginal bleeding in pregnancy, first trimester     PLAN Discharge home  Allergies as of 07/25/2017      Reactions   Lactose Intolerance (gi) Diarrhea      Medication List    STOP taking these medications   acetaminophen 500 MG tablet Commonly known as:  TYLENOL     TAKE these medications   oxyCODONE-acetaminophen 5-325 MG tablet Commonly known as:  PERCOCET/ROXICET Take 1-2 tablets by mouth every 6 (six) hours as needed for severe pain.   prenatal multivitamin Tabs tablet Take 1 tablet by mouth daily at 12  noon.      Follow-up [facility] for [place] Follow up.   Specialty:  Obstetrics and Gynecology Why:  In one week for labs, in two weeks for visit with provider. Return to MAU as needed for emergencies. Contact information: [address] [place] Certified Nurse-Midwife 07/25/2017  3:00 AM

## 2017-07-31 ENCOUNTER — Ambulatory Visit: Payer: Self-pay

## 2017-07-31 ENCOUNTER — Ambulatory Visit (HOSPITAL_COMMUNITY): Payer: Self-pay

## 2017-08-23 ENCOUNTER — Other Ambulatory Visit: Payer: Self-pay

## 2017-08-23 ENCOUNTER — Encounter: Payer: Medicaid Other | Admitting: Student

## 2018-06-12 ENCOUNTER — Encounter (HOSPITAL_COMMUNITY): Payer: Self-pay

## 2019-04-02 IMAGING — US US OB TRANSVAGINAL
2 series · 15 of 28 positions shown · non-contrast
Comparison: None.

CLINICAL DATA: Abdominal pain, pregnancy

EXAM:
TWIN OBSTETRIC <14WK US AND TRANSVAGINAL OB US

[Series 1: us ob transvaginal · 7 of 41 slices shown (1 of 2)]
[im 1/41]
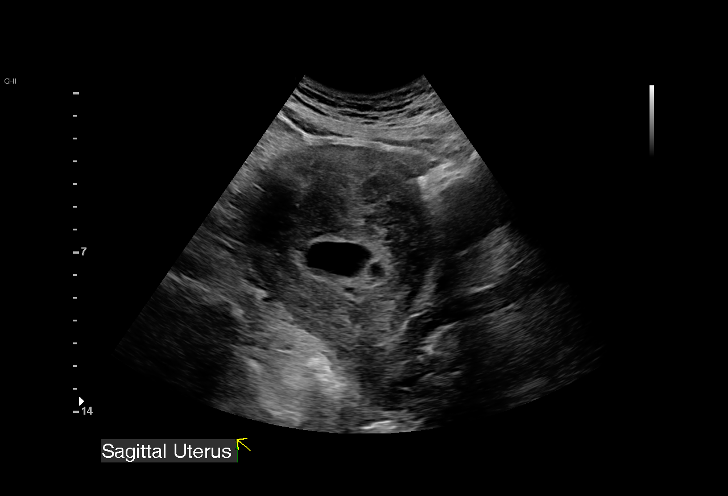
[im 7/41]
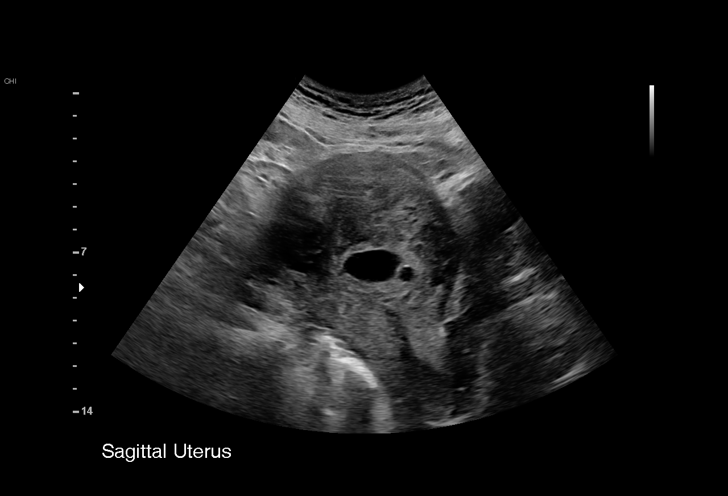
[im 14/41]
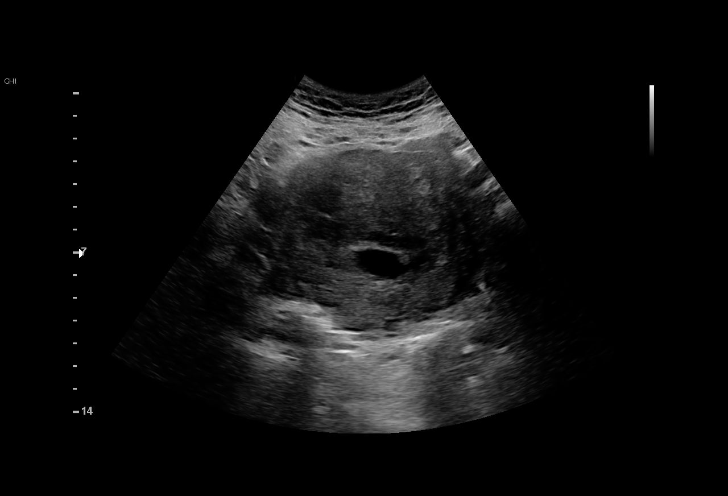
[im 21/41]
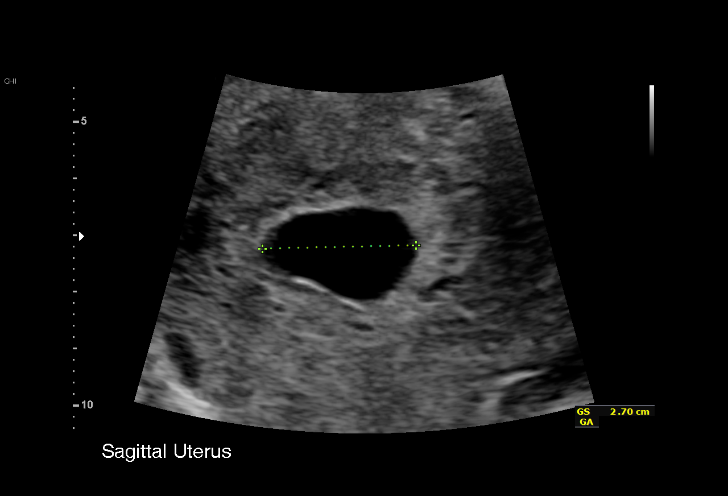
[im 27/41]
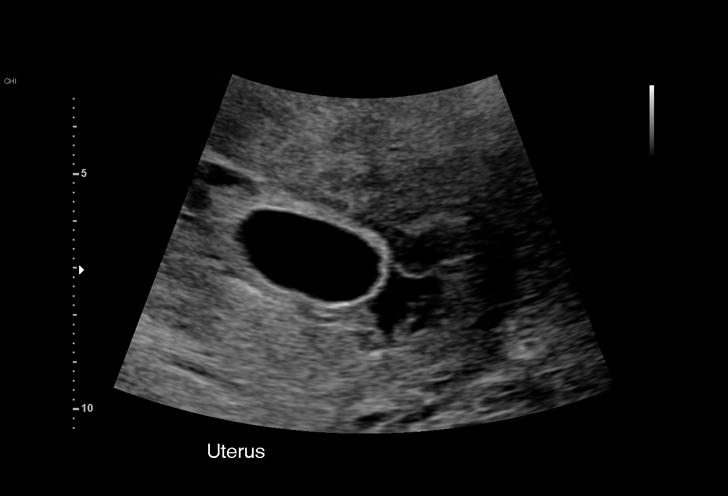
[im 34/41]
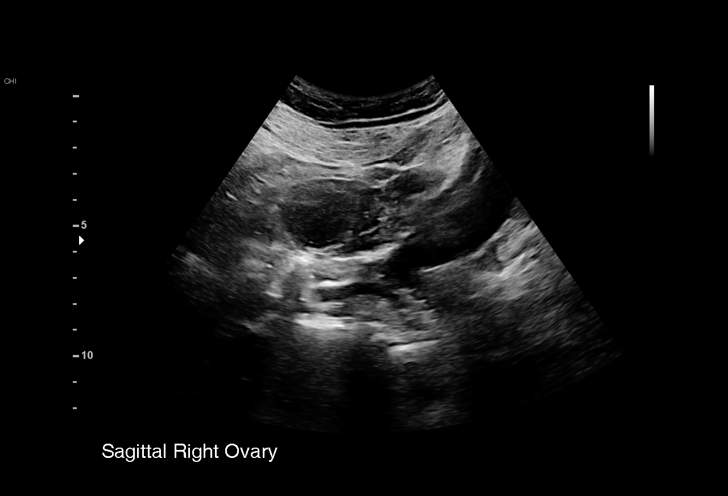
[im 41/41]
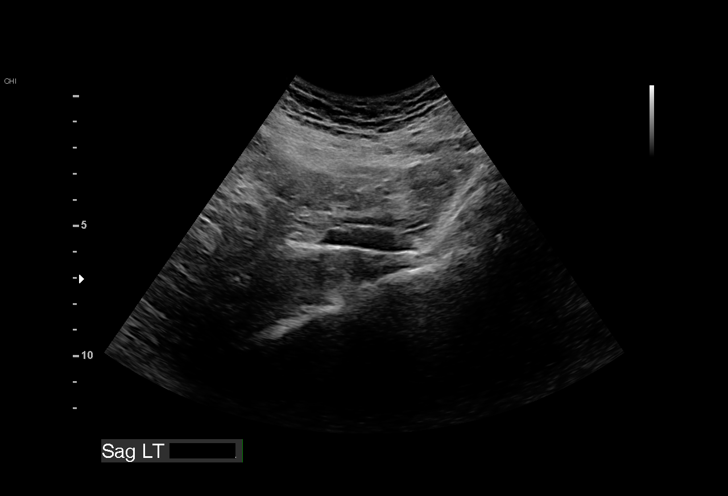

[Series 2: us ob transvaginal · 8 of 49 slices shown (2 of 2)]
[im 4/49]
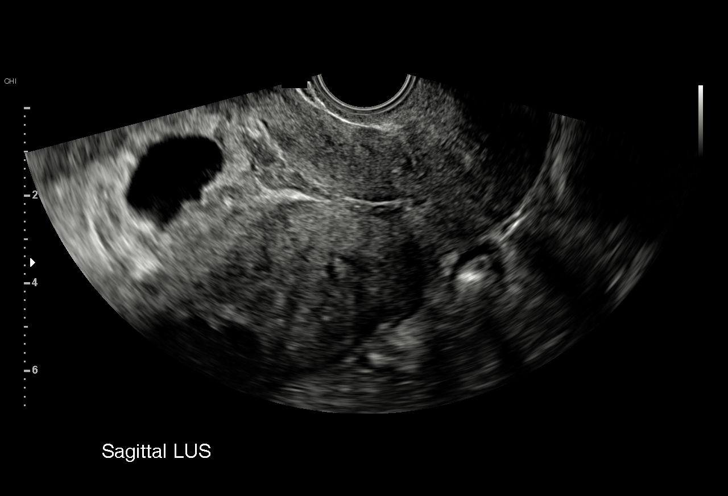
[im 7/49]
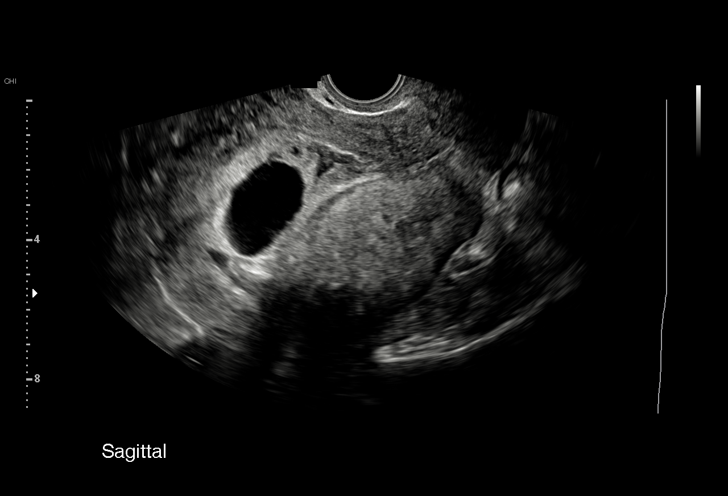
[im 14/49]
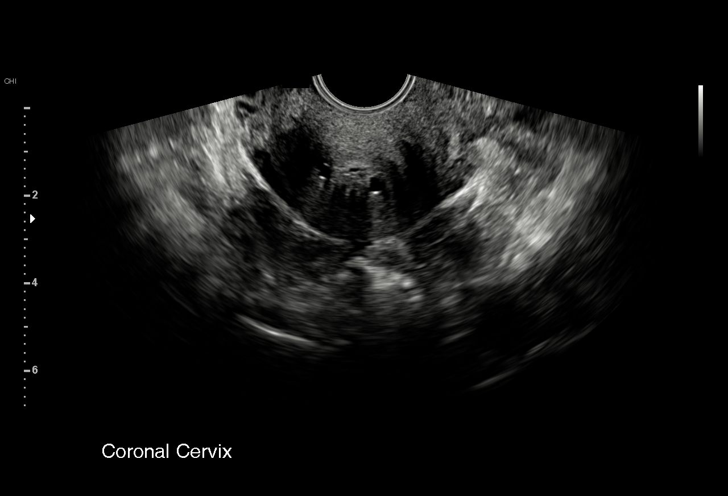
[im 21/49]
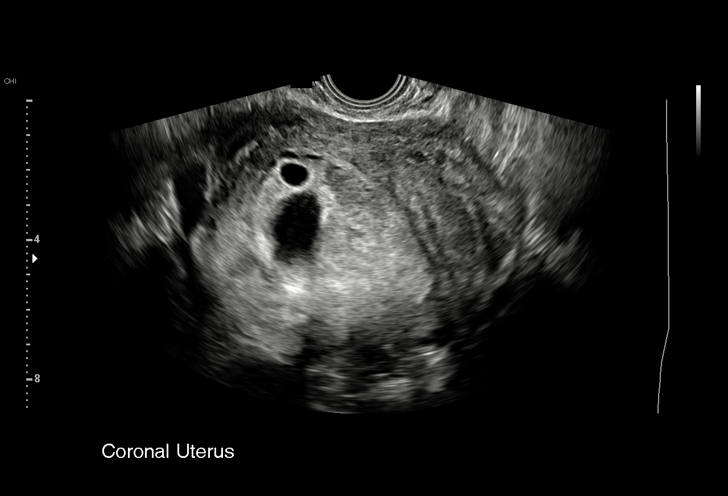
[im 28/49]
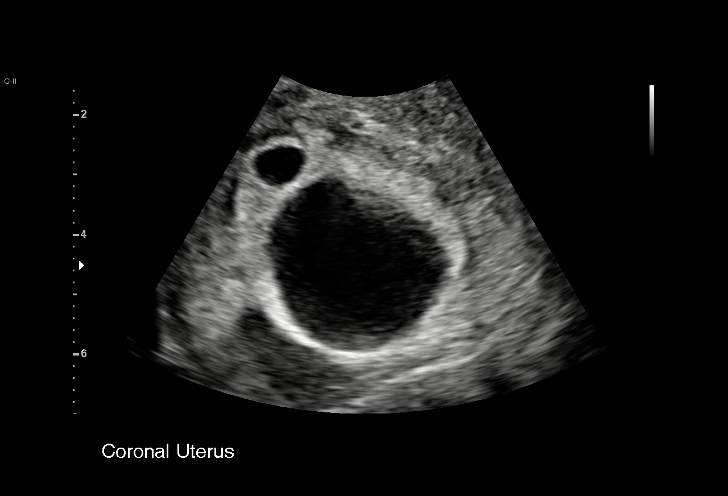
[im 35/49]
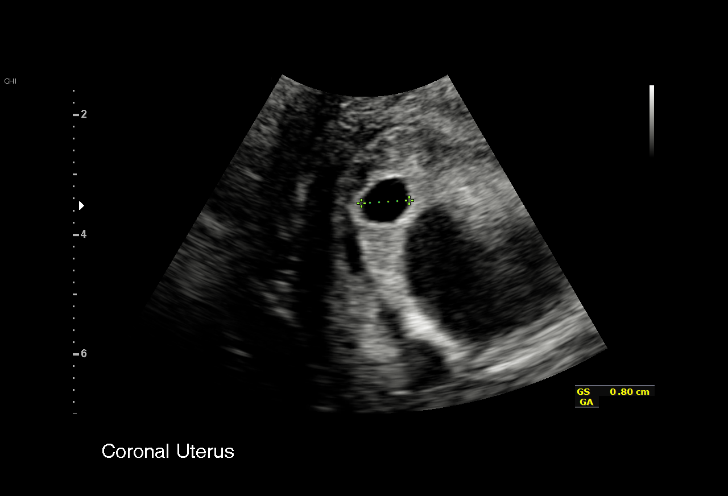
[im 42/49]
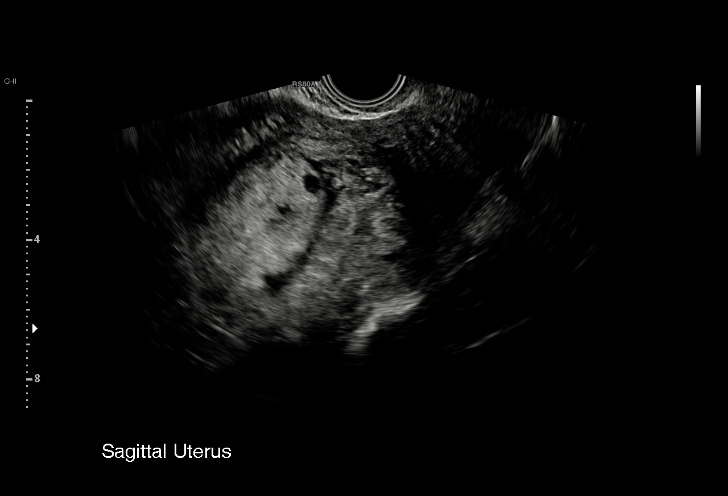
[im 49/49]
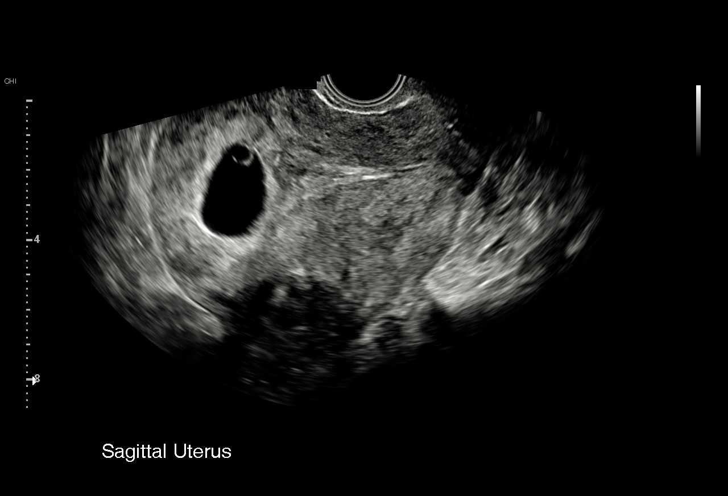

[15 of 28 positions shown; findings below may reference images not displayed]

FINDINGS: Number of IUPs:  2

Chorionicity/Amnionicity:  Dichorionic-diamniotic (thick membrane)

TWIN 1

Yolk sac:  Visualized

Embryo:  Not visualized

Cardiac Activity: Not visualized

Heart Rate:  bpm

MSD: 26.1  mm   7 w   4  d

CRL:    mm    w  d                  US EDC:

TWIN 2

Yolk sac:  Not visualized

Embryo:  Not visualized

Cardiac Activity: Not visualized

Heart Rate:  bpm

MSD: 7.4  mm   5 w   3  d

CRL:    mm    w  d                  US EDC:

Subchorionic hemorrhage:  Small subchorionic hemorrhage

Maternal uterus/adnexae: Posterior mid uterus fibroid measures up to
3.5 cm. Trace free fluid in the pelvis.
IMPRESSION: Dichorionic diamniotic twin intrauterine pregnancy with discordant
size is of the gestational sacs, the largest with SI estimated
gestational age of 7 weeks 4 days and the smaller with an estimated
gestational age of 5 weeks 3 days. No fetal pole in either
gestational sac currently. Recommend follow-up ultrasound in 14 days
to assess progression.

Small subchorionic hemorrhage.

Posterior midbody 3.5 cm uterine fibroid.

## 2019-04-16 IMAGING — US US OB TRANSVAGINAL
1 series · 15 of 28 positions shown · non-contrast
Comparison: 07/03/2017

CLINICAL DATA: Diamniotic dichorionic twin pregnancy. Assess
viability. By first ultrasound patient is 8 weeks 3 days.

EXAM:
TWIN OBSTETRICAL ULTRASOUND <14 WKS

[Series 1: us ob transvaginal · 57 acquisitions, 15 frames shown]
[im 1/57]
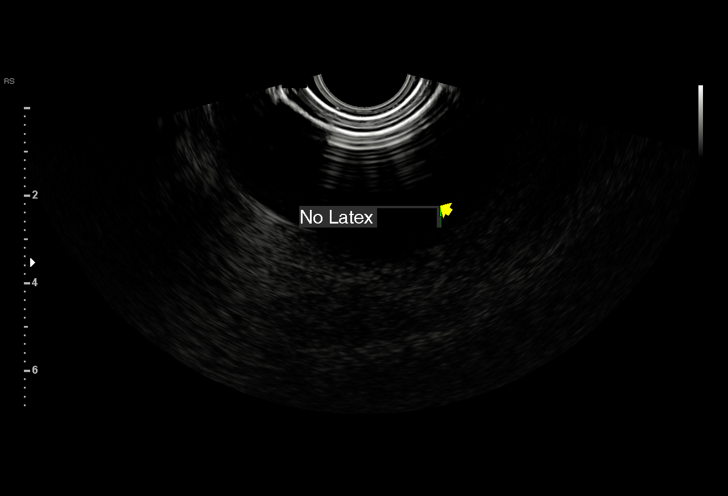
[im 5/57]
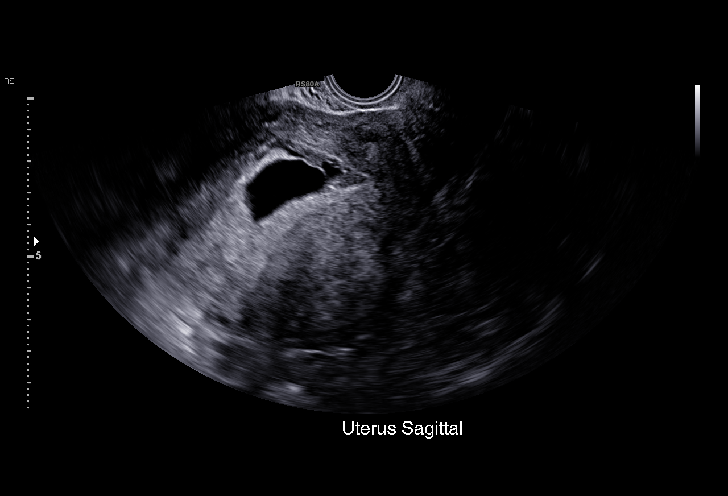
[im 9/57]
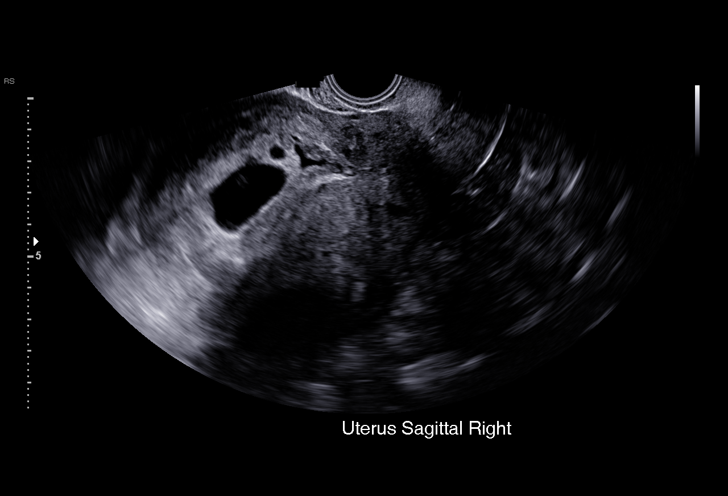
[im 13/57]
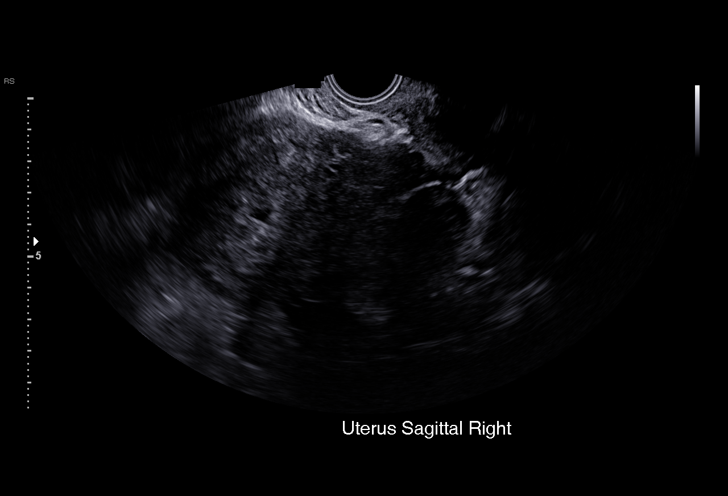
[im 17/57]
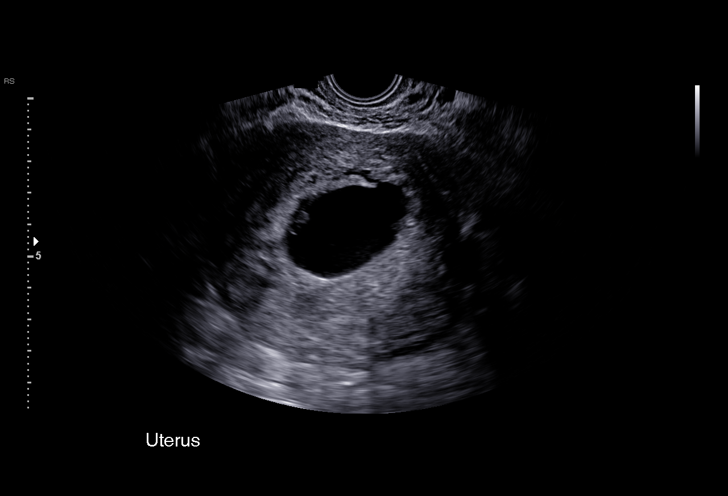
[im 21/57]
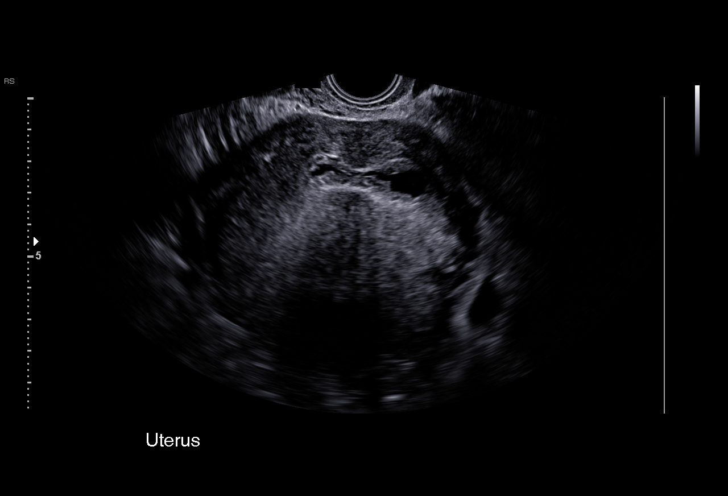
[im 25/57]
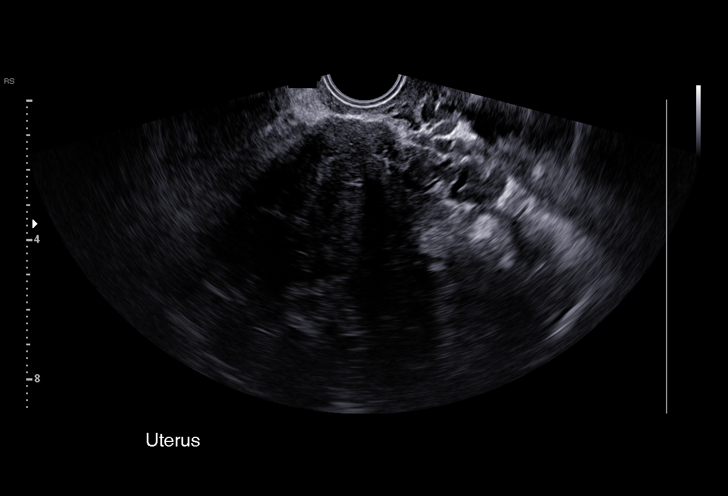
[im 30/57]
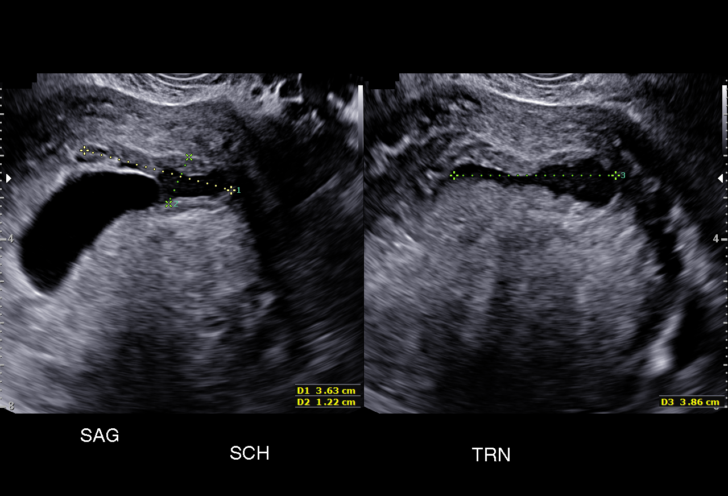
[im 32/57]
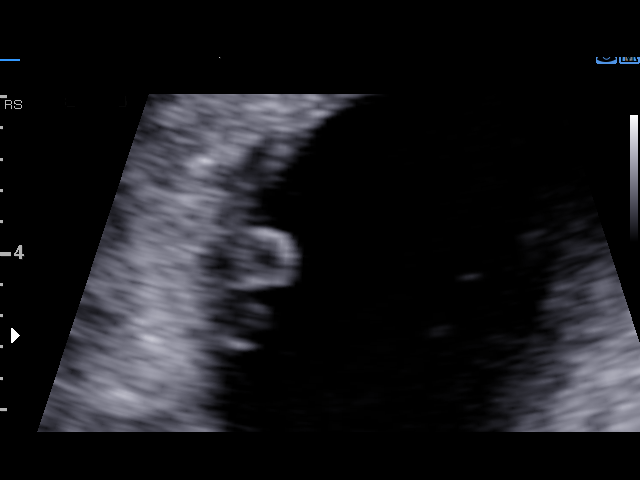
[im 36/57]
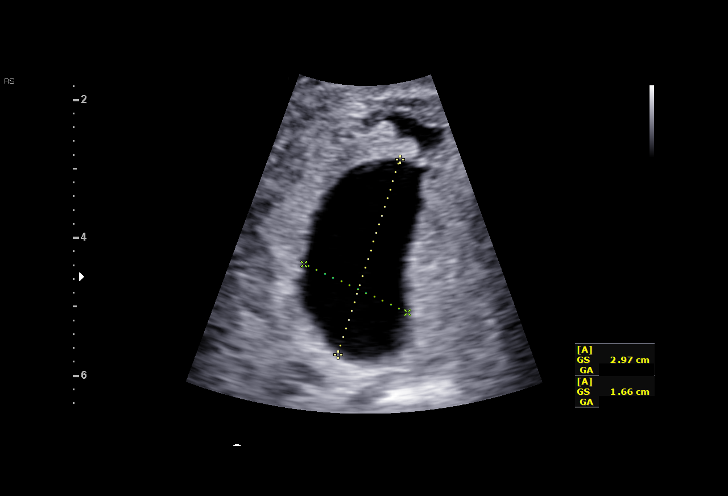
[im 40/57]
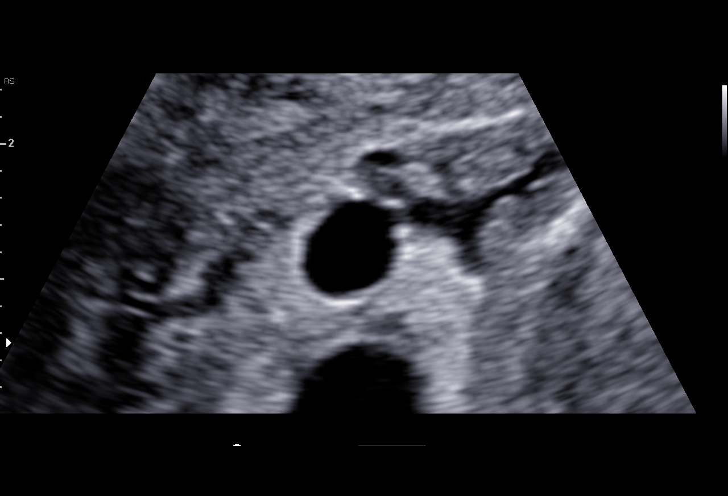
[im 44/57]
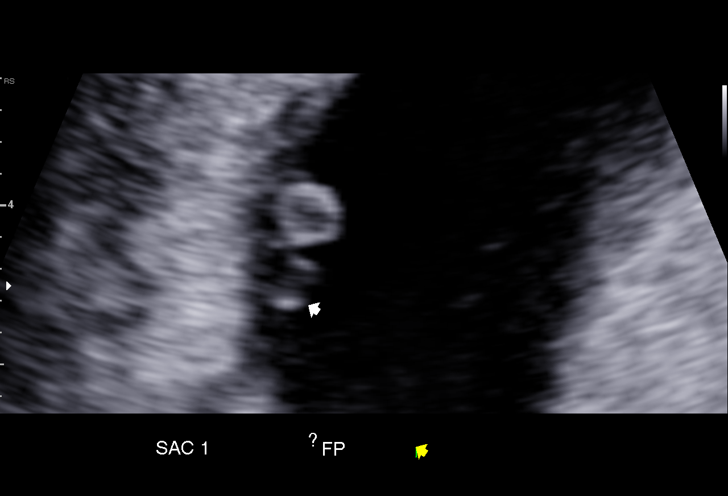
[im 48/57]
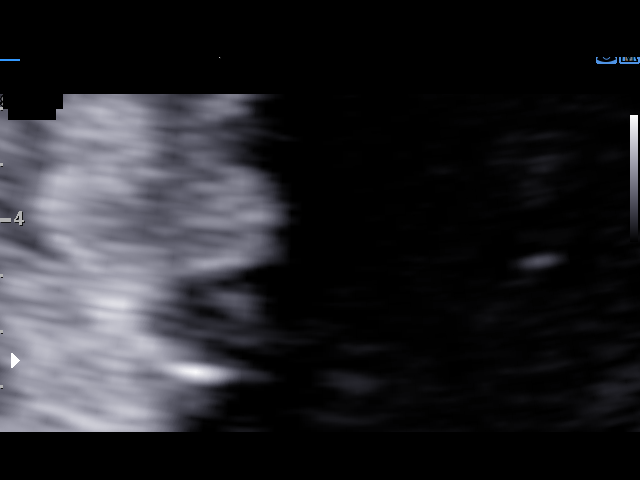
[im 52/57]
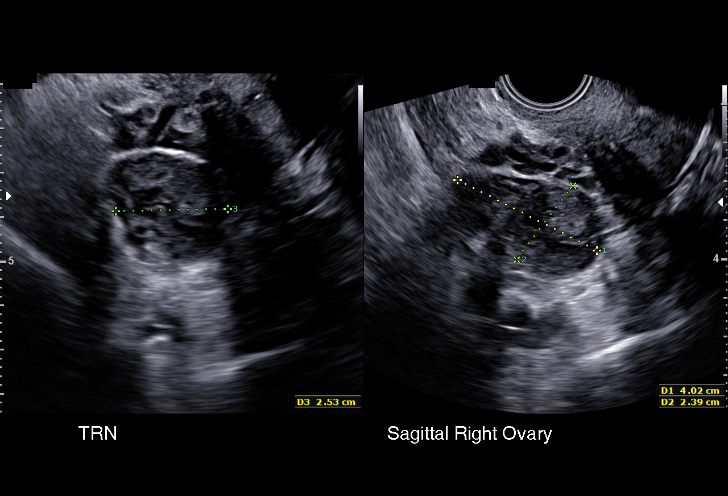
[im 57/57]
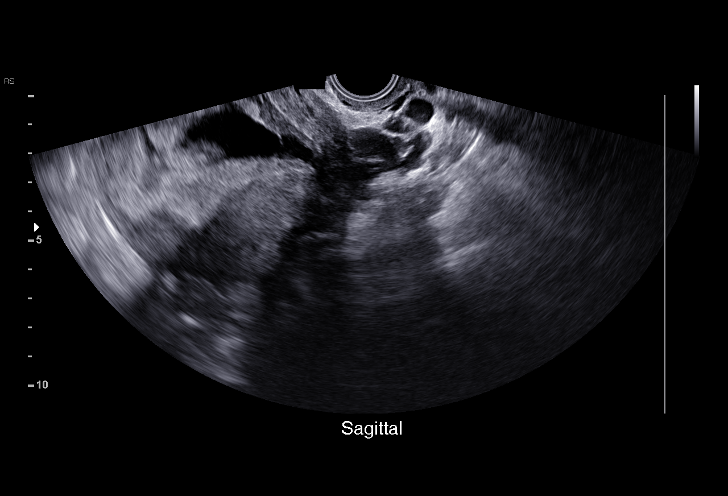

[15 of 28 positions shown; findings below may reference images not displayed]

FINDINGS: Number of IUPs:  2

Chorionicity/Amnionicity:  Diamniotic dichorionic

TWIN 1

Yolk sac:  Present

Embryo:  Small structure likely representing the embryo

Cardiac Activity: Equivocal, possible activity identified at
real-time evaluation.

Heart Rate: Not measurable bpm

MSD: 23.1  mm   7 w   2  d

CRL:   3.7  mm   6 w 0 d                  US EDC: 03/12/2018

TWIN 2

Yolk sac:  None

Embryo:  None

Cardiac Activity: None

Heart Rate: Absent bpm

MSD: 6.9  mm   5 w   2  d

Subchorionic hemorrhage:  Moderate subchorionic hemorrhage.

Maternal uterus/adnexae: Right corpus luteum cyst. The left ovary is
not well seen because of intervening bowel loops. Anterior uterine
fibroid is 3.5 x 2.4 x 3.2 cm. No free pelvic fluid.
IMPRESSION: 1. Interval appearance of probable embryo measuring 3.7 mm and 6
weeks 0 days in gestational sac 1. Possible fetal cardiac activity,
only identified at real-time evaluation.
2. Lack of progression of the structures within gestational sac 2.
3. Moderate subchorionic hemorrhage.
4. Uterine fibroid.

## 2019-04-22 IMAGING — US US OB TRANSVAGINAL
1 series · 15 of 28 positions shown · non-contrast
Comparison: None.

CLINICAL DATA: History of twin pregnancy. Increased vaginal
bleeding.

EXAM:
TRANSVAGINAL OB ULTRASOUND
TECHNIQUE: Transvaginal ultrasound was performed for complete evaluation of the
gestation as well as the maternal uterus, adnexal regions, and
pelvic cul-de-sac.

[Series 1: us ob transvaginal · 15 of 40 slices shown]
[im 1/40]
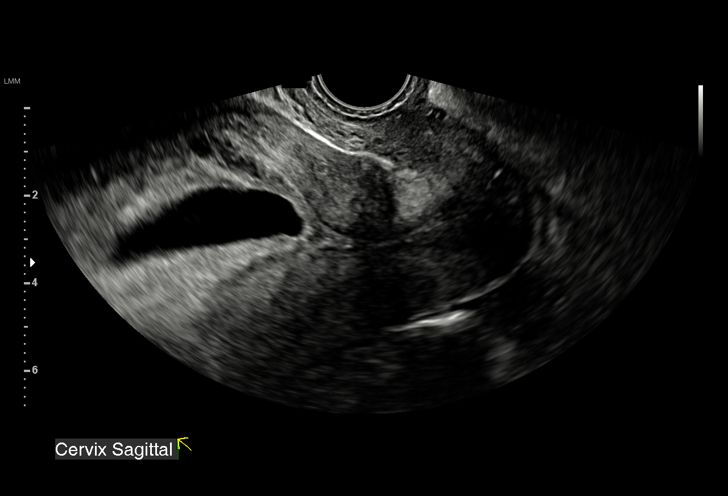
[im 3/40]
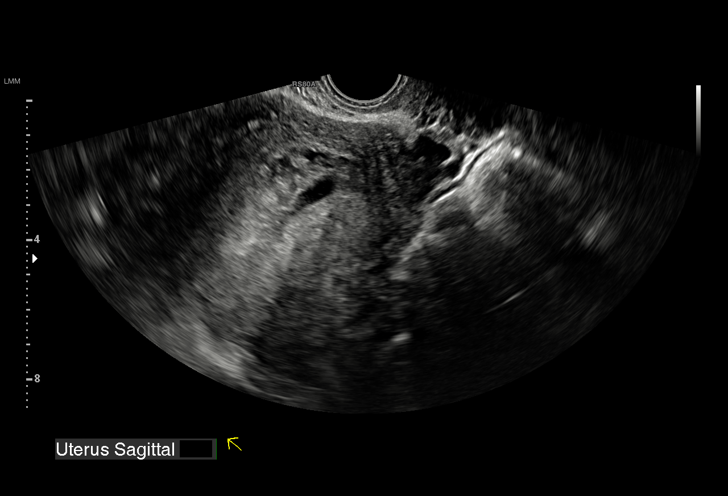
[im 6/40]
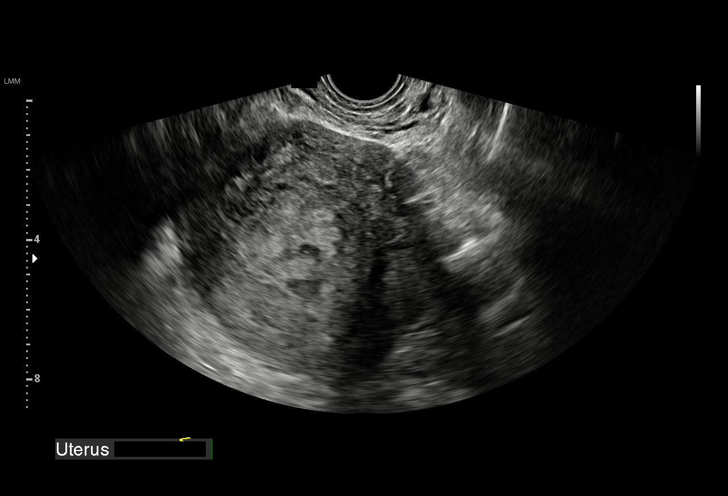
[im 9/40]
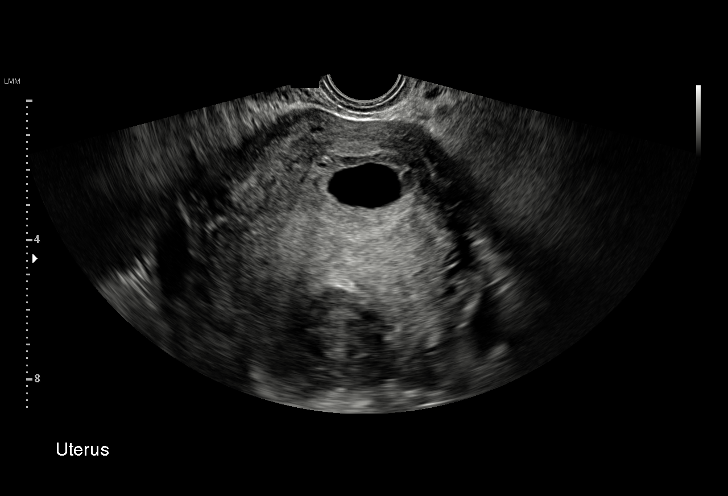
[im 12/40]
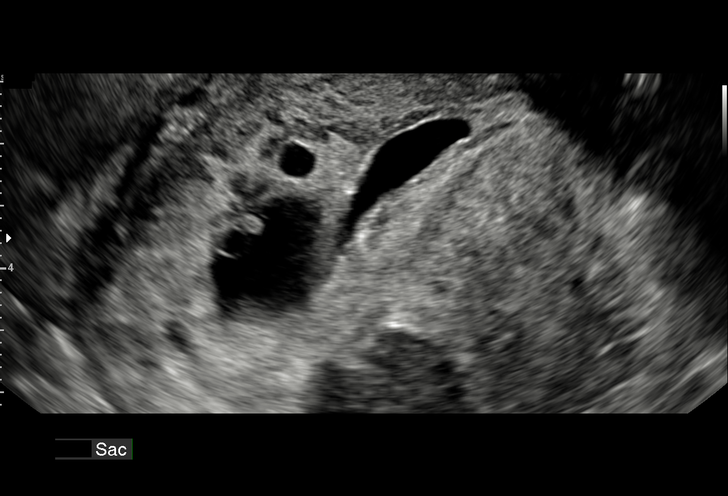
[im 15/40]
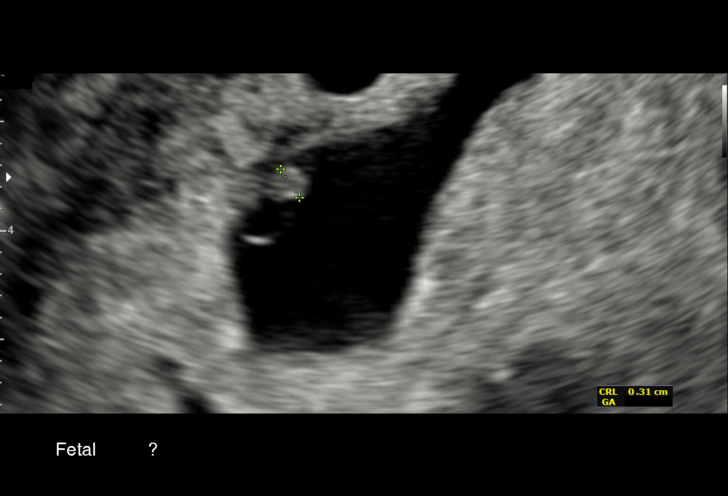
[im 18/40]
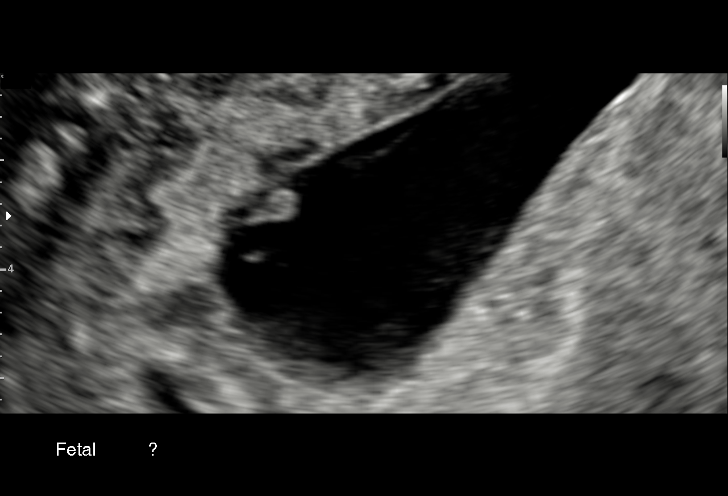
[im 21/40]
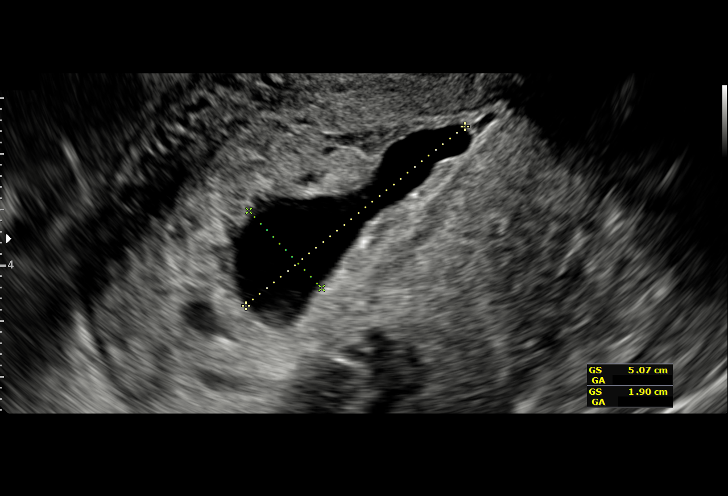
[im 22/40]
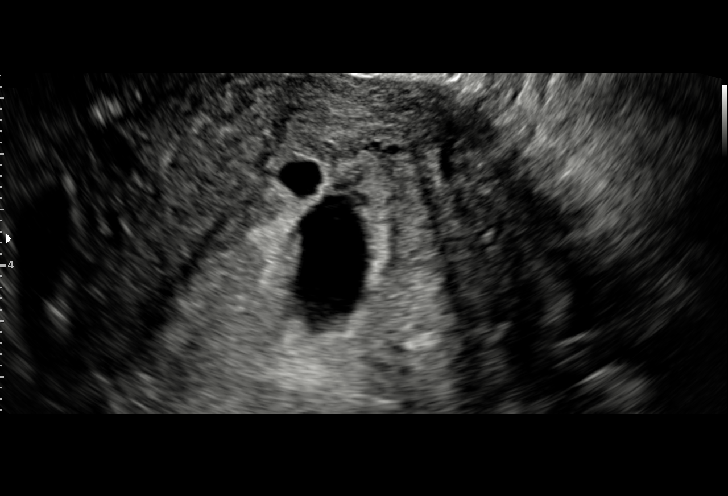
[im 25/40]
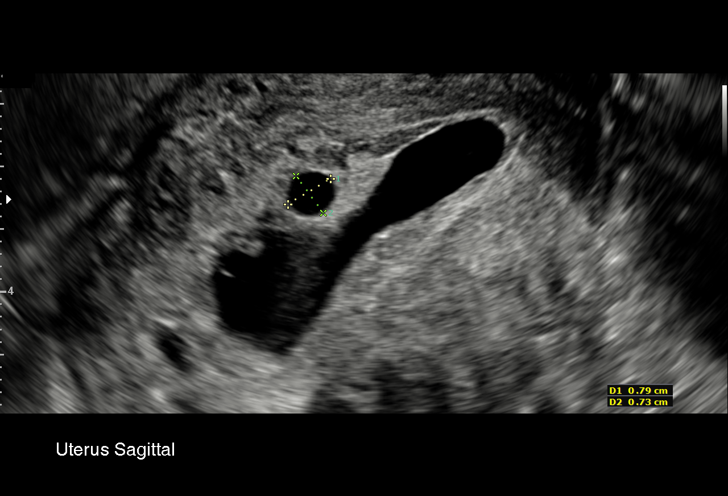
[im 28/40]
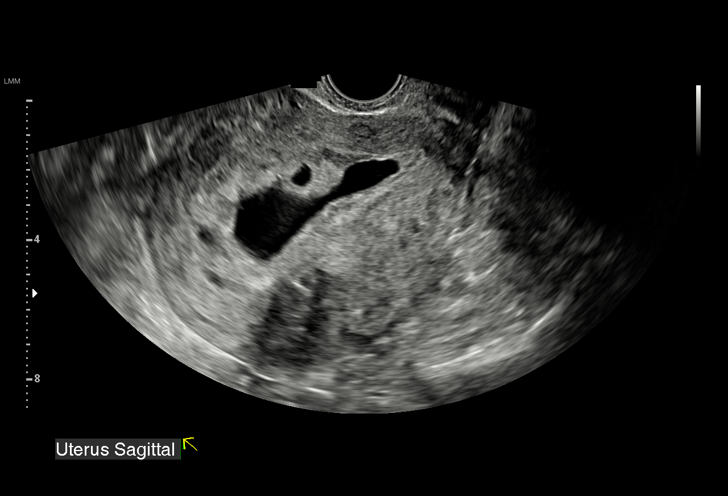
[im 31/40]
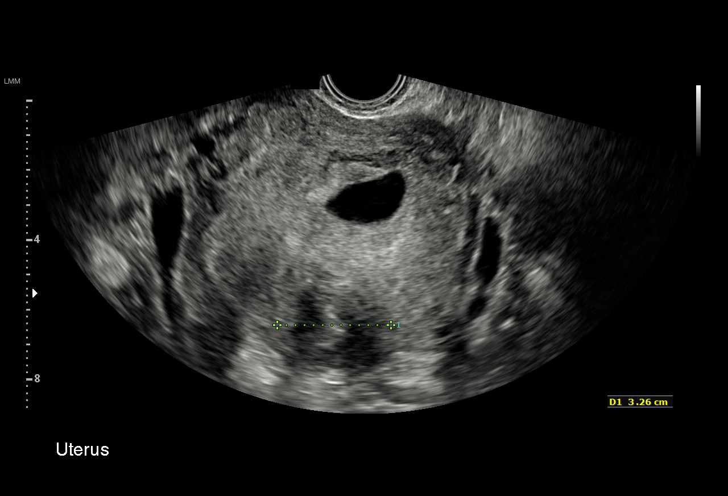
[im 34/40]
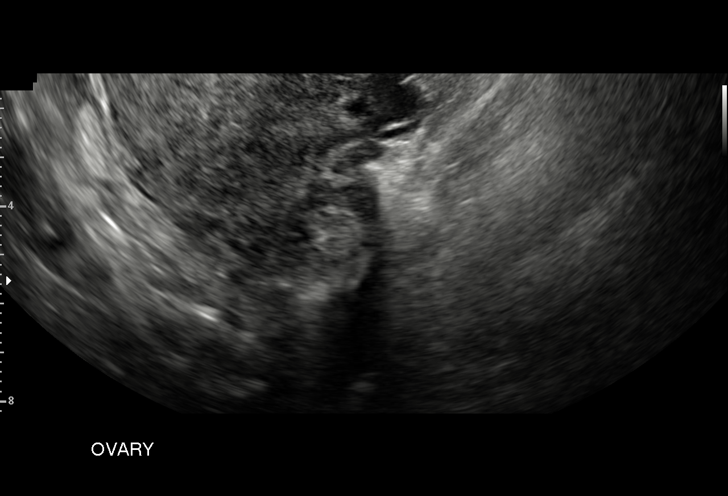
[im 37/40]
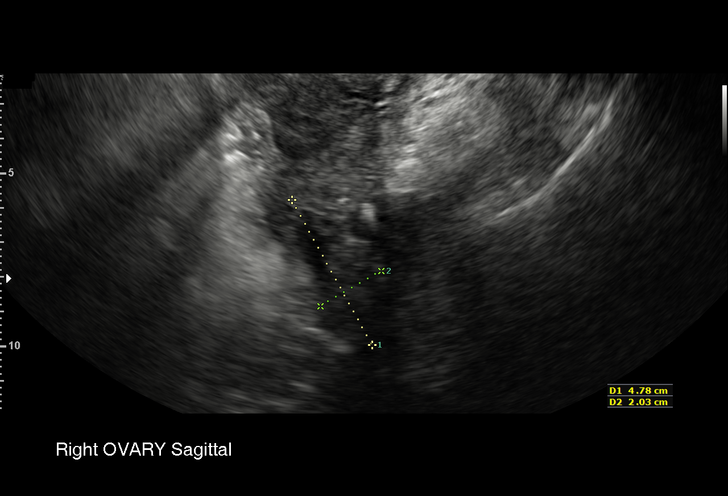
[im 40/40]
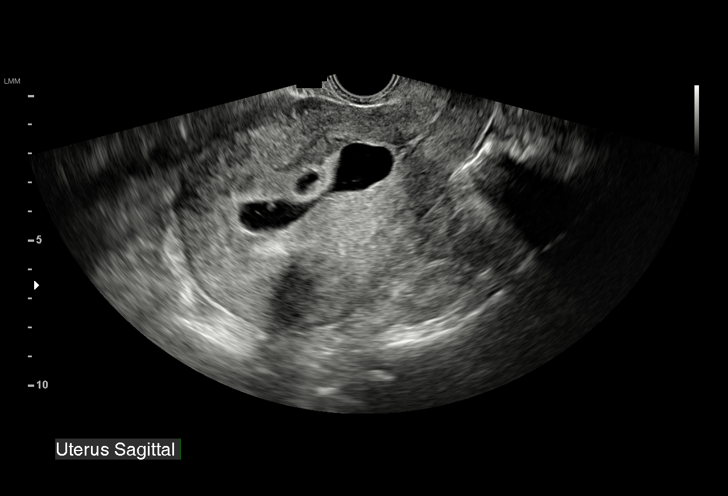

[15 of 28 positions shown; findings below may reference images not displayed]

FINDINGS: Intrauterine gestational sac: There is an intrauterine gestational
sac identified within mean diameter 2.8 cm.

Yolk sac:  Visualized.

Embryo:  Probable

Cardiac Activity: Not Visualized.

MSD: 28  mm   7 w   5  d

CRL:   29  mm   5 w 5 d                  US EDC: March 20, 2018

Subchorionic hemorrhage: The previously identified subchorionic
hemorrhage is not seen today.

An adjacent cystic structure measures 8 x 8 x 8 mm with no yolk sac
or fetal pole. This was previously described as a second gestational
sac. An endometrial cyst or synechiae associated with the first
gestational sac are also possible.

Maternal uterus/adnexae: Normal
IMPRESSION: 1. There is at least 1 intrauterine pregnancy. The mean sac diameter
is 28 mm today versus 23.1 mm previously. There is a yolk sac. There
is a suspected fetal pole. The suspected fetal pole measures 2.9 mm
today versus 3.7 mm previously. No cardiac activity is identified.
The findings are suspicious for but not diagnostic of non viability.
Recommend a follow-up ultrasound to assess viability.
2. The subchorionic hemorrhage seen previously is not visualized
today.
3. The adjacent fluid collection could represent a second
gestational sac. The mean diameter is 8 mm today versus 6.9 mm
previously. No yolk sac or fetal pole. An endometrial cyst or a
synechiae associated with the first gestational sac are
possibilities as well. Recommend attention on follow-up.
